# Patient Record
Sex: Female | Born: 1965 | Race: White | Hispanic: No | Marital: Married | Smoking: Former smoker
Health system: Southern US, Community
[De-identification: ages and names within clinical notes are randomized; demographics above are authoritative.]

## PROBLEM LIST (undated history)

## (undated) DIAGNOSIS — G35D Multiple sclerosis, unspecified: Secondary | ICD-10-CM

## (undated) DIAGNOSIS — H539 Unspecified visual disturbance: Secondary | ICD-10-CM

## (undated) DIAGNOSIS — M1990 Unspecified osteoarthritis, unspecified site: Secondary | ICD-10-CM

## (undated) DIAGNOSIS — M5030 Other cervical disc degeneration, unspecified cervical region: Secondary | ICD-10-CM

## (undated) DIAGNOSIS — G35 Multiple sclerosis: Secondary | ICD-10-CM

## (undated) DIAGNOSIS — M810 Age-related osteoporosis without current pathological fracture: Secondary | ICD-10-CM

## (undated) HISTORY — DX: Age-related osteoporosis without current pathological fracture: M81.0

## (undated) HISTORY — DX: Unspecified visual disturbance: H53.9

## (undated) HISTORY — DX: Unspecified osteoarthritis, unspecified site: M19.90

## (undated) HISTORY — PX: BREAST BIOPSY: SHX20

## (undated) HISTORY — DX: Other cervical disc degeneration, unspecified cervical region: M50.30

## (undated) HISTORY — DX: Multiple sclerosis, unspecified: G35.D

## (undated) HISTORY — DX: Multiple sclerosis: G35

---

## 2012-12-07 DIAGNOSIS — M810 Age-related osteoporosis without current pathological fracture: Secondary | ICD-10-CM | POA: Insufficient documentation

## 2014-02-25 DIAGNOSIS — E785 Hyperlipidemia, unspecified: Secondary | ICD-10-CM | POA: Insufficient documentation

## 2014-02-25 DIAGNOSIS — M19011 Primary osteoarthritis, right shoulder: Secondary | ICD-10-CM | POA: Insufficient documentation

## 2014-02-25 DIAGNOSIS — G43909 Migraine, unspecified, not intractable, without status migrainosus: Secondary | ICD-10-CM | POA: Insufficient documentation

## 2014-04-21 DIAGNOSIS — E559 Vitamin D deficiency, unspecified: Secondary | ICD-10-CM | POA: Insufficient documentation

## 2015-07-15 DIAGNOSIS — F331 Major depressive disorder, recurrent, moderate: Secondary | ICD-10-CM | POA: Insufficient documentation

## 2015-07-23 DIAGNOSIS — M5416 Radiculopathy, lumbar region: Secondary | ICD-10-CM | POA: Insufficient documentation

## 2015-07-23 DIAGNOSIS — M5030 Other cervical disc degeneration, unspecified cervical region: Secondary | ICD-10-CM | POA: Insufficient documentation

## 2016-05-30 DIAGNOSIS — R29898 Other symptoms and signs involving the musculoskeletal system: Secondary | ICD-10-CM | POA: Insufficient documentation

## 2016-06-13 DIAGNOSIS — Z9181 History of falling: Secondary | ICD-10-CM | POA: Insufficient documentation

## 2016-07-13 ENCOUNTER — Ambulatory Visit (INDEPENDENT_AMBULATORY_CARE_PROVIDER_SITE_OTHER): Payer: 59 | Admitting: Neurology

## 2016-07-13 ENCOUNTER — Encounter: Payer: Self-pay | Admitting: *Deleted

## 2016-07-13 ENCOUNTER — Encounter: Payer: Self-pay | Admitting: Neurology

## 2016-07-13 VITALS — BP 102/76 | HR 68 | Resp 16 | Ht 65.0 in | Wt 151.0 lb

## 2016-07-13 DIAGNOSIS — R4189 Other symptoms and signs involving cognitive functions and awareness: Secondary | ICD-10-CM

## 2016-07-13 DIAGNOSIS — R208 Other disturbances of skin sensation: Secondary | ICD-10-CM | POA: Diagnosis not present

## 2016-07-13 DIAGNOSIS — G3184 Mild cognitive impairment, so stated: Secondary | ICD-10-CM | POA: Diagnosis not present

## 2016-07-13 DIAGNOSIS — R5383 Other fatigue: Secondary | ICD-10-CM | POA: Diagnosis not present

## 2016-07-13 DIAGNOSIS — G4700 Insomnia, unspecified: Secondary | ICD-10-CM | POA: Diagnosis not present

## 2016-07-13 DIAGNOSIS — R269 Unspecified abnormalities of gait and mobility: Secondary | ICD-10-CM

## 2016-07-13 DIAGNOSIS — G35 Multiple sclerosis: Secondary | ICD-10-CM | POA: Diagnosis not present

## 2016-07-13 MED ORDER — ARMODAFINIL 250 MG PO TABS: 250.0000 mg | ORAL_TABLET | Freq: Every day | ORAL | 5 refills | Status: DC

## 2016-07-13 MED ORDER — GABAPENTIN 300 MG PO CAPS: ORAL_CAPSULE | ORAL | 11 refills | Status: DC

## 2016-07-13 NOTE — Progress Notes (Signed)
[facility]  PATIENT: Diane Carson DOB: 06/14/1966  REFERRING DOCTOR OR PCP:  Referred by Dr. Stacy Gardner. Her PCP is Guerry Bruin. SOURCE: Patient, notes from Dr. Anne Hahn, MRI and lab reports, MRI images on PACS.  _________________________________   HISTORICAL  CHIEF COMPLAINT:  Chief Complaint  Patient presents with  . Multiple Sclerosis    Diane Carson is here for 2nd opinion regarding tx. for MS.  Not sure if she will then stay with Dr. Anne Hahn in [facility], as she lives there, or transfer care here to Dr. Epimenio Foot.  Sts. she was dx. in December 2017.  Dx. with MRI T-spine, Brain.  No LP. Sts. she  has been having pain/numbness bilat feet,  electrical jolt sensations bilat legs, fatigue for yrs.  Sts. in 2013, she had an episode of numbness from the chest down--was treated with IV steroids at [facility] and numbness improved.  Has seen Dr. Hollice Espy at  . Numbness    [facility] for same./fim    HISTORY OF PRESENT ILLNESS:  I had the pleasure seeing you patient, Diane Carson, at [facility] for neurologic second opinion consultation regarding a diagnosis of multiple sclerosis.  In 1999, she had left optic neuritis. She did not receive steroids at that time but did have an MRI of the brain that was reportedly normal. She did very well for the next 14 years. In 2013, she woke up with numbness in her feet the one day. Over the next 2 days numbness evolved to be more intense and be in the distribution up to the mid thoracic level.   She also had decreased balance and fell a couple times. She noted that the numbness was near total of the breast. She received several days of IV steroids and symptoms improved, but not completely to baseline.     Over the last 4 years, she has had no definite exacerbation. However, she has noted more difficulty with cognitive function, emotional lability, and fatigue. She used to work part-time without difficulty and  is unable to do that currently.  Gait/strength/sensation:   She notes that her gait will get a little worse if she walks longer distances. She stumbles some but does not fall. She notes that the right leg will give out. If she goes longer distance the right leg will become more tired. There are also dysesthetic sensations in both feet. She is walking on pebbles.   Bladder/bowel: She denies any difficulty with bladder urgency, frequency or hesitancy. She does not have nocturia. She has some constipation but this has been a long-term issue for her.  Vision: Even though the optic neuritis was on the left, she notes more of visual blurring on the right. She does not note any asymmetry in color vision. She has seen her ophthalmologist within the last year and was just prescribed reading glasses. Told that there was not evidence of her prior optic neuritis.  Fatigue/sleep:   She reports fatigue that occurs on a daily basis. In general, it is milder in the morning and is worse later in the day, especially if she has had a more active day. Worse during warm weather or if she gets hot.   She has both sleep onset and sleep maintenance insomnia. Trazodone has helped the sleep onset but she still wakes up multiple times at night and sometimes has trouble getting back asleep.  Mood/cognition:   She reports both depression and anxiety. She has been on Lexapro with some benefit. It  has helped her tearfulness and anxiety some but incompletely. She still has a lot of irritability and apathy. She gets frustrated easily. She notes cognitive difficulties that have progressed over the last couple of years. She has a lot of difficulty with recall but does better with hints. She has some trouble with verbal fluency, parallel processing and executive function.  She will sometimes have inappropriate crying.  I personally reviewed the MRI of the brain and spine dated 06/07/2016 and compared with the MRI dated 08/28/2011.  The  older MRI shows a couple small T2/FLAIR hyperintense foci in the deep white matter and white matter. On the current study, there are about 6 or 7 more foci in the hemispheres including more in the periventricular white matter and 2 foci in the right cerebellar hemisphere. The 2017 MRI of the cervical spine shows mild degenerative changes (with foraminal narrowing to the left at C7T1) 5but a normal spinal cord. The 2017 MRI of the thoracic spine shows a chronic plaque at T7.  Also, there is a disc protrusion at T9-T10.     I also reviewed laboratory data from December 2017.The NMO Ab is negative.  Vitamin D is low normal.  Cholesterol is mildly elevated.  REVIEW OF SYSTEMS: Constitutional: No fevers, chills, sweats, or change in appetite.   She has fatigue and sleepiness Eyes: No visual changes, double vision, eye pain Ear, nose and throat: No hearing loss, ear pain, nasal congestion, sore throat Cardiovascular: No chest pain, palpitations Respiratory: No shortness of breath at rest or with exertion.   No wheezes GastrointestinaI: No nausea, vomiting, diarrhea, abdominal pain, fecal incontinence Genitourinary: No dysuria, urinary retention or frequency.  No nocturia. Musculoskeletal: No neck pain, back pain Integumentary: No rash, pruritus, skin lesions Neurological: as above Psychiatric: No depression at this time.  No anxiety Endocrine: No palpitations, diaphoresis, change in appetite, change in weigh or increased thirst Hematologic/Lymphatic: No anemia, purpura, petechiae. Allergic/Immunologic: No itchy/runny eyes, nasal congestion, recent allergic reactions, rashes  ALLERGIES: Allergies  Allergen Reactions  . Codeine Shortness Of Breath    HOME MEDICATIONS:  Current Outpatient Prescriptions:  .  ALPRAZolam (XANAX) 0.5 MG tablet, , Disp: , Rfl:  .  calcium carbonate (OSCAL) 1500 (600 Ca) MG TABS tablet, Take 600 mg of elemental calcium by mouth 2 (two) times daily with a meal.,  Disp: , Rfl:  .  cholecalciferol (VITAMIN D) 1000 units tablet, Take 5,000 Units by mouth daily., Disp: , Rfl:  .  escitalopram (LEXAPRO) 20 MG tablet, TAKE 1 TABLET(20 MG) BY MOUTH DAILY, Disp: , Rfl:  .  meloxicam (MOBIC) 15 MG tablet, TAKE 1 TABLET(15 MG) BY MOUTH DAILY, Disp: , Rfl:  .  Multiple Vitamin (MULTIVITAMIN) tablet, Take 1 tablet by mouth daily., Disp: , Rfl:  .  traZODone (DESYREL) 50 MG tablet, , Disp: , Rfl:  .  Armodafinil 250 MG tablet, Take 1 tablet (250 mg total) by mouth daily., Disp: 30 tablet, Rfl: 5 .  gabapentin (NEURONTIN) 300 MG capsule, Take one pill po qAM and 2 pills po qHS, Disp: 90 capsule, Rfl: 11  PAST MEDICAL HISTORY: Past Medical History:  Diagnosis Date  . Degenerative disc disease, cervical   . Multiple sclerosis (HCC)   . Osteoarthritis    thumbs  . Osteoporosis   . Vision abnormalities     PAST SURGICAL HISTORY: Past Surgical History:  Procedure Laterality Date  . BREAST BIOPSY      FAMILY HISTORY: Family History  Problem Relation Age of  Onset  . Adopted: Yes    SOCIAL HISTORY:  Social History   Social History  . Marital status: Married    Spouse name: N/A  . Number of children: N/A  . Years of education: N/A   Occupational History  . Not on file.   Social History Main Topics  . Smoking status: Former Games developer  . Smokeless tobacco: Never Used  . Alcohol use No  . Drug use:     Types: Marijuana     Comment: daily marijuana use  . Sexual activity: Not on file   Other Topics Concern  . Not on file   Social History Narrative  . No narrative on file     PHYSICAL EXAM  Vitals:   07/13/16 0906  BP: 102/76  Pulse: 68  Resp: 16  Weight: 151 lb (68.5 kg)  Height: 5\' 5"  (1.651 m)    Body mass index is 25.13 kg/m.   General: The patient is well-developed and well-nourished and in no acute distress  Eyes:  Funduscopic exam shows normal optic discs and retinal vessels.  Neck: The neck is supple, no carotid  bruits are noted.  The neck is nontender.  Cardiovascular: The heart has a regular rate and rhythm with a normal S1 and S2. There were no murmurs, gallops or rubs. Lungs are clear to auscultation.  Skin: Extremities are without significant edema.  Musculoskeletal:  Back is nontender  Neurologic Exam  Mental status: The patient is alert and oriented x 3 at the time of the examination. The patient has apparent normal recent and remote memory, with an apparently normal attention span and concentration ability.   Speech is normal.  Cranial nerves: Extraocular movements are full. Pupils are equal, round, and reactive to light and accomodation.  Visual fields are full.  Facial symmetry is present. There is good facial sensation to soft touch bilaterally.Facial strength is normal.  Trapezius and sternocleidomastoid strength is normal. No dysarthria is noted.  The tongue is midline, and the patient has symmetric elevation of the soft palate. No obvious hearing deficits are noted.  Motor:  Muscle bulk is normal.   Tone is normal. Strength is  5 / 5 in all 4 extremities.   Sensory: Sensory testing is intact to pinprick, soft touch and vibration sensation in all 4 extremities.  Coordination: Cerebellar testing reveals good finger-nose-finger and heel-to-shin bilaterally.  Gait and station: Station is normal.   Gait is normal. Tandem gait is wide.  Romberg is negative.   Reflexes: Deep tendon reflexes are symmetric and normalin arms.   She has increased DTRs in legs with spread at the knees.   Plantar responses are flexor.    DIAGNOSTIC DATA (LABS, IMAGING, TESTING) - I reviewed patient records, labs, notes, testing and imaging myself where available.      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 The combination of her history, physical and MRI findings are consistent with definite relapsing remitting multiple sclerosis.    We had a long conversation about these modifying therapies to reduce lapses and future disability.  She is most interested in one of the oral agents and we discussed the pros and cons of the options and she decided to start Tecfidera. I will check a baseline CBC and liver function tests. Additionally, I am going to check her JCV antibody. If she has progression, I would consider a switch to Tysabri help her fatigue and cognitive fog.     Due to her fatigue with some daytime sleepiness, we will start Nuvigil. Additionally I adjusted her gabapentin dose to 300 mg in the morning and 600 mg at night to try to help her insomnia and dysesthesias more.  She will return to see me in 3-4 months or sooner if there are any new or worsening neurologic symptoms.  Thank you for asking me to see Mrs. Sowder for a neurologic consultation. Please let me know if I can be of further assistance with her or other patients in the future.    Elycia Woodside A. Epimenio Foot, MD, PhD 07/13/2016, 11:42 AM Certified in Neurology, Clinical Neurophysiology, Sleep Medicine, Pain Medicine and Neuroimaging  [facility] [address] [place], [place] [address] [phone]

## 2016-07-14 ENCOUNTER — Encounter: Payer: Self-pay | Admitting: *Deleted

## 2016-07-14 LAB — CBC WITH DIFFERENTIAL/PLATELET
BASOS: 1 %
Basophils Absolute: 0.1 10*3/uL (ref 0.0–0.2)
EOS (ABSOLUTE): 0.1 10*3/uL (ref 0.0–0.4)
Eos: 1 %
HEMOGLOBIN: 13.2 g/dL (ref 11.1–15.9)
Hematocrit: 39.2 % (ref 34.0–46.6)
IMMATURE GRANS (ABS): 0 10*3/uL (ref 0.0–0.1)
IMMATURE GRANULOCYTES: 0 %
LYMPHS: 30 %
Lymphocytes Absolute: 2.4 10*3/uL (ref 0.7–3.1)
MCH: 30.1 pg (ref 26.6–33.0)
MCHC: 33.7 g/dL (ref 31.5–35.7)
MCV: 89 fL (ref 79–97)
MONOCYTES: 8 %
Monocytes Absolute: 0.7 10*3/uL (ref 0.1–0.9)
NEUTROS ABS: 4.9 10*3/uL (ref 1.4–7.0)
NEUTROS PCT: 60 %
PLATELETS: 397 10*3/uL — AB (ref 150–379)
RBC: 4.39 x10E6/uL (ref 3.77–5.28)
RDW: 14 % (ref 12.3–15.4)
WBC: 8.2 10*3/uL (ref 3.4–10.8)

## 2016-07-14 LAB — HEPATIC FUNCTION PANEL
ALK PHOS: 61 IU/L (ref 39–117)
ALT: 21 IU/L (ref 0–32)
AST: 25 IU/L (ref 0–40)
Albumin: 4.5 g/dL (ref 3.5–5.5)
BILIRUBIN TOTAL: 0.2 mg/dL (ref 0.0–1.2)
BILIRUBIN, DIRECT: 0.08 mg/dL (ref 0.00–0.40)
TOTAL PROTEIN: 7 g/dL (ref 6.0–8.5)

## 2016-07-17 ENCOUNTER — Telehealth: Payer: Self-pay | Admitting: *Deleted

## 2016-07-17 NOTE — Telephone Encounter (Signed)
Tecfidera PA completed and faxed to [facility] fax# [phone]/fim

## 2016-07-18 NOTE — Telephone Encounter (Signed)
Fax received from [employer] (phone# [phone]).   Tecfidera PA approved thru 07-17-21.  PA# [id number]/fim

## 2016-08-01 DIAGNOSIS — G5603 Carpal tunnel syndrome, bilateral upper limbs: Secondary | ICD-10-CM | POA: Insufficient documentation

## 2016-09-07 DIAGNOSIS — M1811 Unilateral primary osteoarthritis of first carpometacarpal joint, right hand: Secondary | ICD-10-CM | POA: Insufficient documentation

## 2016-09-07 HISTORY — PX: JOINT REPLACEMENT: SHX530

## 2016-10-19 ENCOUNTER — Encounter: Payer: Self-pay | Admitting: Neurology

## 2016-10-19 ENCOUNTER — Encounter (INDEPENDENT_AMBULATORY_CARE_PROVIDER_SITE_OTHER): Payer: Self-pay

## 2016-10-19 ENCOUNTER — Ambulatory Visit (INDEPENDENT_AMBULATORY_CARE_PROVIDER_SITE_OTHER): Payer: 59 | Admitting: Neurology

## 2016-10-19 VITALS — BP 121/75 | HR 78 | Resp 16 | Ht 65.0 in | Wt 145.0 lb

## 2016-10-19 DIAGNOSIS — G35 Multiple sclerosis: Secondary | ICD-10-CM

## 2016-10-19 DIAGNOSIS — R5383 Other fatigue: Secondary | ICD-10-CM

## 2016-10-19 DIAGNOSIS — R208 Other disturbances of skin sensation: Secondary | ICD-10-CM

## 2016-10-19 DIAGNOSIS — R269 Unspecified abnormalities of gait and mobility: Secondary | ICD-10-CM

## 2016-10-19 DIAGNOSIS — G3184 Mild cognitive impairment, so stated: Secondary | ICD-10-CM

## 2016-10-19 DIAGNOSIS — R4189 Other symptoms and signs involving cognitive functions and awareness: Secondary | ICD-10-CM

## 2016-10-19 MED ORDER — ARMODAFINIL 250 MG PO TABS: 250.0000 mg | ORAL_TABLET | Freq: Every day | ORAL | 5 refills | Status: DC

## 2016-10-19 NOTE — Progress Notes (Signed)
[facility]  PATIENT: Diane Carson DOB: 10/28/1965  REFERRING DOCTOR OR PCP:  Referred by Dr. Stacy Gardner. Her PCP is Guerry Bruin. SOURCE: Patient, notes from Dr. Anne Hahn, MRI and lab reports, MRI images on PACS.  _________________________________   HISTORICAL  CHIEF COMPLAINT:  Chief Complaint  Patient presents with  . Multiple Sclerosis    Sts. she is tolerating Tecfiderea well.  Nuvigil doesn't help fatigue but has helped brain fog./fim    HISTORY OF PRESENT ILLNESS:   Diane Carson is a 51 yo woman with MS.  MS:   She was started on Tecfidera and tolerates it well.   She had a few episodes of flushing  But no problems the last month.   SHe has not had nay new MS symptoms or exacerbations.     Gait/strength/sensation:   She notes that her gait will get a little worse if she walks longer distances. She stumbles some but does not fall. She notes that the right leg will give out. If she goes longer distance the right leg will become more tired. There are also dysesthetic sensations in both feet. She is walking on pebbles.   Bladder/bowel: She denies any difficulty with bladder urgency, frequency or hesitancy. She does not have nocturia. She has some constipation but this has been a long-term issue for her.  Vision: She notes blurry vision bilaterally.   She had ON on the left in the past.   Colors do not appear desaturated.  . She has seen her ophthalmologist within the last year. Told that there was not evidence of her prior optic neuritis.  Fatigue/sleep:   She has daily fatigue.   Fatigue did not improve as much as mental fog did on the armodafinil.  Fatigue is milder in the morning and is worse later in the day, especially if she has had a more active day. Worse during warm weather or if she gets hot.   She has both sleep onset and sleep maintenance insomnia. Trazodone has helped the sleep onset and sleep maintenance issues.   Mood/cognition:    She reports both depression and anxiety. She has been on Lexapro with some benefit. It has helped her tearfulness and anxiety some but incompletely. She still has a lot of irritability and apathy. She gets frustrated easily. She notes cognitive difficulties that have progressed over the last couple of years. She ws having a lot of difficulty with recall and verbal fluency and parallel processing --- all better on Armodafinil.   .  MS History:    In 1999, she had left optic neuritis. She did not receive steroids at that time but did have an MRI of the brain that was reportedly normal. She did very well for the next 14 years. In 2013, she woke up with numbness in her feet the one day. Over the next 2 days numbness evolved to be more intense and be in the distribution up to the mid thoracic level.   She also had decreased balance and fell a couple times. She noted that the numbness was near total of the breast. She received several days of IV steroids and symptoms improved, but not completely to baseline.    Between 2013 and 2017,  she has had no definite exacerbation. However, she has noted more difficulty with cognitive function, emotional lability, and fatigue.    MRI of the brain and spine dated 06/07/2016 and compared with the MRI dated 08/28/2011.  The older MRI shows a couple  small T2/FLAIR hyperintense foci in the deep white matter and white matter. On the current study, there are about 6 or 7 more foci in the hemispheres including more in the periventricular white matter and 2 foci in the right cerebellar hemisphere. The 2017 MRI of the cervical spine shows mild degenerative changes (with foraminal narrowing to the left at C7T1) 5but a normal spinal cord. The 2017 MRI of the thoracic spine shows a chronic plaque at T7.  Also, there is a disc protrusion at T9-T10.     I also reviewed laboratory data from December 2017.The NMO Ab is negative.  Vitamin D is low normal.    REVIEW OF SYSTEMS: Constitutional:  No fevers, chills, sweats, or change in appetite.   She has fatigue and sleepiness Eyes: No visual changes, double vision, eye pain Ear, nose and throat: No hearing loss, ear pain, nasal congestion, sore throat Cardiovascular: No chest pain, palpitations Respiratory: No shortness of breath at rest or with exertion.   No wheezes GastrointestinaI: No nausea, vomiting, diarrhea, abdominal pain, fecal incontinence Genitourinary: No dysuria, urinary retention or frequency.  No nocturia. Musculoskeletal: No neck pain, back pain Integumentary: No rash, pruritus, skin lesions Neurological: as above Psychiatric: No depression at this time.  No anxiety Endocrine: No palpitations, diaphoresis, change in appetite, change in weigh or increased thirst Hematologic/Lymphatic: No anemia, purpura, petechiae. Allergic/Immunologic: No itchy/runny eyes, nasal congestion, recent allergic reactions, rashes  ALLERGIES: Allergies  Allergen Reactions  . Codeine Shortness Of Breath    HOME MEDICATIONS:  Current Outpatient Prescriptions:  .  ALPRAZolam (XANAX) 0.5 MG tablet, , Disp: , Rfl:  .  Armodafinil 250 MG tablet, Take 1 tablet (250 mg total) by mouth daily., Disp: 30 tablet, Rfl: 5 .  calcium carbonate (OSCAL) 1500 (600 Ca) MG TABS tablet, Take 600 mg of elemental calcium by mouth 2 (two) times daily with a meal., Disp: , Rfl:  .  cholecalciferol (VITAMIN D) 1000 units tablet, Take 5,000 Units by mouth daily., Disp: , Rfl:  .  Dimethyl Fumarate 240 MG CPDR, Take 240 mg by mouth 2 (two) times daily., Disp: , Rfl:  .  escitalopram (LEXAPRO) 20 MG tablet, TAKE 1 TABLET(20 MG) BY MOUTH DAILY, Disp: , Rfl:  .  gabapentin (NEURONTIN) 300 MG capsule, Take one pill po qAM and 2 pills po qHS, Disp: 90 capsule, Rfl: 11 .  meloxicam (MOBIC) 15 MG tablet, TAKE 1 TABLET(15 MG) BY MOUTH DAILY, Disp: , Rfl:  .  Multiple Vitamin (MULTIVITAMIN) tablet, Take 1 tablet by mouth daily., Disp: , Rfl:  .  traZODone  (DESYREL) 50 MG tablet, , Disp: , Rfl:   PAST MEDICAL HISTORY: Past Medical History:  Diagnosis Date  . Degenerative disc disease, cervical   . Multiple sclerosis (HCC)   . Osteoarthritis    thumbs  . Osteoporosis   . Vision abnormalities     PAST SURGICAL HISTORY: Past Surgical History:  Procedure Laterality Date  . BREAST BIOPSY      FAMILY HISTORY: Family History  Problem Relation Age of Onset  . Adopted: Yes    SOCIAL HISTORY:  Social History   Social History  . Marital status: Married    Spouse name: N/A  . Number of children: N/A  . Years of education: N/A   Occupational History  . Not on file.   Social History Main Topics  . Smoking status: Former Games developer  . Smokeless tobacco: Never Used  . Alcohol use No  . Drug use: Yes  Types: Marijuana     Comment: daily marijuana use  . Sexual activity: Not on file   Other Topics Concern  . Not on file   Social History Narrative  . No narrative on file     PHYSICAL EXAM  Vitals:   10/19/16 1305  BP: 121/75  Pulse: 78  Resp: 16  Weight: 145 lb (65.8 kg)  Height: 5\' 5"  (1.651 m)    Body mass index is 24.13 kg/m.   General: The patient is well-developed and well-nourished and in no acute distress    Neurologic Exam  Mental status: The patient is alert and oriented x 3 at the time of the examination. The patient has apparent normal recent and remote memory, with an apparently normal attention span and concentration ability.   Speech is normal.  Cranial nerves: Extraocular movements are full. Facial strength and sensation are normal.  Trapezius and sternocleidomastoid strength is normal. No dysarthria is noted.  The tongue is midline, and the patient has symmetric elevation of the soft palate. No obvious hearing deficits are noted.  Motor:  Muscle bulk is normal.   Tone is normal. Strength is  5 / 5 in all 4 extremities.   Sensory: Sensory testing is intact to pinprick, soft touch and vibration  sensation in arms bu decreased vibration in both feet, left worse than right..  Coordination: Cerebellar testing reveals good finger-nose-finger and heel-to-shin bilaterally.  Gait and station: Station is normal.   Gait is normal. Tandem gait is wide.  Romberg is negative.   Reflexes: Deep tendon reflexes are symmetric and normalin arms.   She has increased DTRs in legs with spread at the knees.        DIAGNOSTIC DATA (LABS, IMAGING, TESTING) - I reviewed patient records, labs, notes, testing and imaging myself where available.      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   Continue Tecfidera.   Check CBC with Diff.   Later this year we will check an MRI of the brain to make sure that there is not subclinical progression. If present, we will need to consider a different medication. 2.   Continue armodafinil 3.    She will return to see me in 5-6 months or sooner if there are new or worsening neurologic symptoms.  Janeisha Ryle A. Epimenio Foot, MD, PhD 10/19/2016, 1:24 PM Certified in Neurology, Clinical Neurophysiology, Sleep Medicine, Pain Medicine and Neuroimaging  [facility] [address], [address] [place], [place] [address] [phone]

## 2016-10-20 LAB — CBC WITH DIFFERENTIAL/PLATELET
BASOS: 1 %
Basophils Absolute: 0.1 10*3/uL (ref 0.0–0.2)
EOS (ABSOLUTE): 0.1 10*3/uL (ref 0.0–0.4)
Eos: 1 %
Hematocrit: 37.4 % (ref 34.0–46.6)
Hemoglobin: 12.3 g/dL (ref 11.1–15.9)
IMMATURE GRANS (ABS): 0 10*3/uL (ref 0.0–0.1)
Immature Granulocytes: 0 %
LYMPHS: 39 %
Lymphocytes Absolute: 3.9 10*3/uL — ABNORMAL HIGH (ref 0.7–3.1)
MCH: 29.1 pg (ref 26.6–33.0)
MCHC: 32.9 g/dL (ref 31.5–35.7)
MCV: 88 fL (ref 79–97)
Monocytes Absolute: 1 10*3/uL — ABNORMAL HIGH (ref 0.1–0.9)
Monocytes: 10 %
Neutrophils Absolute: 5 10*3/uL (ref 1.4–7.0)
Neutrophils: 49 %
PLATELETS: 395 10*3/uL — AB (ref 150–379)
RBC: 4.23 x10E6/uL (ref 3.77–5.28)
RDW: 14 % (ref 12.3–15.4)
WBC: 10 10*3/uL (ref 3.4–10.8)

## 2016-10-23 ENCOUNTER — Telehealth: Payer: Self-pay | Admitting: *Deleted

## 2016-10-23 NOTE — Telephone Encounter (Signed)
I have spoken with Diane Carson and per RAS, advised lab work done in our office is fine.  She verbalized understanding of same/fim

## 2016-10-23 NOTE — Telephone Encounter (Signed)
-----   Message from Asa Lente, MD sent at 10/20/2016  3:37 PM EDT ----- Please let the patient know that the lab work is fine.

## 2017-04-18 ENCOUNTER — Other Ambulatory Visit (HOSPITAL_BASED_OUTPATIENT_CLINIC_OR_DEPARTMENT_OTHER): Payer: Self-pay | Admitting: Physician Assistant

## 2017-04-18 DIAGNOSIS — Z1231 Encounter for screening mammogram for malignant neoplasm of breast: Secondary | ICD-10-CM

## 2017-04-20 ENCOUNTER — Encounter: Payer: Self-pay | Admitting: Neurology

## 2017-04-20 ENCOUNTER — Ambulatory Visit (INDEPENDENT_AMBULATORY_CARE_PROVIDER_SITE_OTHER): Payer: 59 | Admitting: Neurology

## 2017-04-20 VITALS — BP 114/72 | HR 76 | Ht 65.0 in | Wt 140.0 lb

## 2017-04-20 DIAGNOSIS — R4189 Other symptoms and signs involving cognitive functions and awareness: Secondary | ICD-10-CM

## 2017-04-20 DIAGNOSIS — R208 Other disturbances of skin sensation: Secondary | ICD-10-CM | POA: Diagnosis not present

## 2017-04-20 DIAGNOSIS — G3184 Mild cognitive impairment, so stated: Secondary | ICD-10-CM | POA: Diagnosis not present

## 2017-04-20 DIAGNOSIS — G35 Multiple sclerosis: Secondary | ICD-10-CM

## 2017-04-20 DIAGNOSIS — R5383 Other fatigue: Secondary | ICD-10-CM | POA: Diagnosis not present

## 2017-04-20 DIAGNOSIS — R269 Unspecified abnormalities of gait and mobility: Secondary | ICD-10-CM

## 2017-04-20 DIAGNOSIS — F418 Other specified anxiety disorders: Secondary | ICD-10-CM | POA: Diagnosis not present

## 2017-04-20 DIAGNOSIS — G4700 Insomnia, unspecified: Secondary | ICD-10-CM | POA: Diagnosis not present

## 2017-04-20 MED ORDER — AMPHETAMINE-DEXTROAMPHET ER 15 MG PO CP24: 15.0000 mg | ORAL_CAPSULE | ORAL | 0 refills | Status: DC

## 2017-04-20 NOTE — Progress Notes (Signed)
[facility]  PATIENT: Diane Carson DOB: 07-Dec-1965  REFERRING DOCTOR OR PCP:  Referred by Dr. Stacy Gardner. Her PCP is Guerry Bruin. SOURCE: Patient, notes from Dr. Anne Hahn, MRI and lab reports, MRI images on PACS.  _________________________________   HISTORICAL  CHIEF COMPLAINT:  Chief Complaint  Patient presents with  . Multiple Sclerosis    She is doing well on Tecfidera.  Her fatigue is worse.  States she could not tolerate Nuvigil due to feeling "scattered and in overdrive".     HISTORY OF PRESENT ILLNESS:   Diane Carson is a 51 yo woman with MS.  Update 04/20/2017:    She feels her MS is mostly doing well. She is on Tecfidera. She gets occasional flushing but otherwise tolerates it well. There has not been any exacerbations. Her gait is doing about the same. She will stumble but has not had any major falls. At times right leg will give out, especially if she goes longer distances. There are some dysesthetic sensations in her feet, helped by gabapentin 300 mg po tid.   She denies any major problems with her bladder. There is chronic constipation. In the past she had optic neuritis on the left sometimes she feels that the vision is slightly blurry.  Her biggest problem is fatigue. She was unable to tolerate Nuvigil as it made her feel "scattered and an old drive".  She tried half a pill but it was not better.   Initially she thought it was helping but she didn't like how she felt. She continues to have insomnia that is both sleep onset and sleep maintenance. Trazodone has helped this. She is on Lexapro for depression and anxiety.  Sometimes she feels overwhelmed (worse with brother having Stage 4 colon cancer).   She has difficulty with short-term memory, focus and attention and other cognitive tasks. Initially, Nuvigil was helping her with this but she stopped that she had trouble tolerating it.   _______________________________________ From  10/19/2016:  MS:   She was started on Tecfidera and tolerates it well.   She had a few episodes of flushing  But no problems the last month.   SHe has not had nay new MS symptoms or exacerbations.     Gait/strength/sensation:   She notes that her gait will get a little worse if she walks longer distances. She stumbles some but does not fall. She notes that the right leg will give out. If she goes longer distance the right leg will become more tired. There are also dysesthetic sensations in both feet. She is walking on pebbles.   Bladder/bowel: She denies any difficulty with bladder urgency, frequency or hesitancy. She does not have nocturia. She has some constipation but this has been a long-term issue for her.  Vision: She notes blurry vision bilaterally.   She had ON on the left in the past.   Colors do not appear desaturated.  . She has seen her ophthalmologist within the last year. Told that there was not evidence of her prior optic neuritis.  Fatigue/sleep:   She has daily fatigue.   Fatigue did not improve as much as mental fog did on the armodafinil.  Fatigue is milder in the morning and is worse later in the day, especially if she has had a more active day. Worse during warm weather or if she gets hot.   She has both sleep onset and sleep maintenance insomnia. Trazodone has helped the sleep onset and sleep maintenance issues.  Mood/cognition:   She reports both depression and anxiety. She has been on Lexapro with some benefit. It has helped her tearfulness and anxiety some but incompletely. She still has a lot of irritability and apathy. She gets frustrated easily. She notes cognitive difficulties that have progressed over the last couple of years. She ws having a lot of difficulty with recall and verbal fluency and parallel processing --- all better on Armodafinil.   .  MS History:    In 1999, she had left optic neuritis. She did not receive steroids at that time but did have an MRI of the  brain that was reportedly normal. She did very well for the next 14 years. In 2013, she woke up with numbness in her feet the one day. Over the next 2 days numbness evolved to be more intense and be in the distribution up to the mid thoracic level.   She also had decreased balance and fell a couple times. She noted that the numbness was near total of the breast. She received several days of IV steroids and symptoms improved, but not completely to baseline.    Between 2013 and 2017,  she has had no definite exacerbation. However, she has noted more difficulty with cognitive function, emotional lability, and fatigue.    MRI of the brain and spine dated 06/07/2016 and compared with the MRI dated 08/28/2011.  The older MRI shows a couple small T2/FLAIR hyperintense foci in the deep white matter and white matter. On the current study, there are about 6 or 7 more foci in the hemispheres including more in the periventricular white matter and 2 foci in the right cerebellar hemisphere. The 2017 MRI of the cervical spine shows mild degenerative changes (with foraminal narrowing to the left at C7T1) 5but a normal spinal cord. The 2017 MRI of the thoracic spine shows a chronic plaque at T7.  Also, there is a disc protrusion at T9-T10.     I also reviewed laboratory data from December 2017.The NMO Ab is negative.  Vitamin D is low normal.    REVIEW OF SYSTEMS: Constitutional: No fevers, chills, sweats, or change in appetite.   She has fatigue and sleepiness Eyes: No visual changes, double vision, eye pain Ear, nose and throat: No hearing loss, ear pain, nasal congestion, sore throat Cardiovascular: No chest pain, palpitations Respiratory: No shortness of breath at rest or with exertion.   No wheezes GastrointestinaI: No nausea, vomiting, diarrhea, abdominal pain, fecal incontinence Genitourinary: No dysuria, urinary retention or frequency.  No nocturia. Musculoskeletal: No neck pain, back pain Integumentary: No  rash, pruritus, skin lesions Neurological: as above Psychiatric: No depression at this time.  No anxiety Endocrine: No palpitations, diaphoresis, change in appetite, change in weigh or increased thirst Hematologic/Lymphatic: No anemia, purpura, petechiae. Allergic/Immunologic: No itchy/runny eyes, nasal congestion, recent allergic reactions, rashes  ALLERGIES: Allergies  Allergen Reactions  . Codeine Shortness Of Breath    HOME MEDICATIONS:  Current Outpatient Prescriptions:  .  ALPRAZolam (XANAX) 0.5 MG tablet, , Disp: , Rfl:  .  calcium carbonate (OSCAL) 1500 (600 Ca) MG TABS tablet, Take 600 mg of elemental calcium by mouth 2 (two) times daily with a meal., Disp: , Rfl:  .  cholecalciferol (VITAMIN D) 1000 units tablet, Take 5,000 Units by mouth daily., Disp: , Rfl:  .  Dimethyl Fumarate 240 MG CPDR, Take 240 mg by mouth 2 (two) times daily., Disp: , Rfl:  .  escitalopram (LEXAPRO) 20 MG tablet, TAKE 1 TABLET(20  MG) BY MOUTH DAILY, Disp: , Rfl:  .  gabapentin (NEURONTIN) 300 MG capsule, Take one pill po qAM and 2 pills po qHS, Disp: 90 capsule, Rfl: 11 .  meloxicam (MOBIC) 15 MG tablet, TAKE 1 TABLET(15 MG) BY MOUTH DAILY, Disp: , Rfl:  .  Multiple Vitamin (MULTIVITAMIN) tablet, Take 1 tablet by mouth daily., Disp: , Rfl:  .  traZODone (DESYREL) 50 MG tablet, , Disp: , Rfl:  .  amphetamine-dextroamphetamine (ADDERALL XR) 15 MG 24 hr capsule, Take 1 capsule by mouth every morning., Disp: 30 capsule, Rfl: 0  PAST MEDICAL HISTORY: Past Medical History:  Diagnosis Date  . Degenerative disc disease, cervical   . Multiple sclerosis (HCC)   . Osteoarthritis    thumbs  . Osteoporosis   . Vision abnormalities     PAST SURGICAL HISTORY: Past Surgical History:  Procedure Laterality Date  . BREAST BIOPSY    . JOINT REPLACEMENT Right 09/07/2016   thumb    FAMILY HISTORY: Family History  Problem Relation Age of Onset  . Adopted: Yes  . Colon cancer Brother     SOCIAL  HISTORY:  Social History   Social History  . Marital status: Married    Spouse name: N/A  . Number of children: N/A  . Years of education: N/A   Occupational History  . Not on file.   Social History Main Topics  . Smoking status: Former Games developermoker  . Smokeless tobacco: Never Used  . Alcohol use No  . Drug use: Yes    Types: Marijuana     Comment: daily marijuana use  . Sexual activity: Not on file   Other Topics Concern  . Not on file   Social History Narrative  . No narrative on file     PHYSICAL EXAM  Vitals:   04/20/17 0939  BP: 114/72  Pulse: 76  Weight: 140 lb (63.5 kg)  Height: 5\' 5"  (1.651 m)    Body mass index is 23.3 kg/m.   General: The patient is well-developed and well-nourished and in no acute distress    Neurologic Exam  Mental status: The patient is alert and oriented x 3 at the time of the examination. The patient has apparent normal recent and remote memory, with an apparently normal attention span and concentration ability.   Speech is normal.  Cranial nerves: Extraocular movements are full. Facial strength and sensation are normal.  Trapezius and sternocleidomastoid strength is normal. No dysarthria is noted.  The tongue is midline, and the patient has symmetric elevation of the soft palate. No obvious hearing deficits are noted.  Motor:  Muscle bulk is normal.   Tone is normal. Strength is  5 / 5 in all 4 extremities.   Sensory: She had reduced vibration sensation in both feet. This was worse on the left. Sensation to touch was normal and symmetric.   Coordination: Cerebellar testing reveals good finger-nose-finger and heel-to-shin bilaterally  Gait and station: Station is normal.   Gait shows mild right foot drop and is mildly wide. Tandem gait is wide.  Romberg is negative.   Reflexes: Deep tendon reflexes are symmetric and normal in arms.   She had increased deep tendon reflexes in the legs and there was spread at the knees.   25 foot  timed walk is 8.2 sec (average of 2 trials)    DIAGNOSTIC DATA (LABS, IMAGING, TESTING) - I reviewed patient records, labs, notes, testing and imaging myself where available.      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   She will continue Tecfidera. We will check a CBC and CMP.  2.    We will check an MRI of the brain to make sure that there is not subclinical progression. If present, we will need to consider a different medication. 3.    Ampyra for gait.   Check renal function labs 4.   Adderall for fatigue and reduced attention. 5.    She will return to see me in 5-6 months or sooner if there are new or worsening neurologic symptoms.  Tyrell Seifer A. Epimenio Foot, MD, PhD 04/20/2017, 2:39 PM Certified in Neurology, Clinical Neurophysiology, Sleep Medicine, Pain Medicine and Neuroimaging  [facility] [address] [place], [place] [address] [phone]

## 2017-04-21 LAB — CBC WITH DIFFERENTIAL/PLATELET
BASOS ABS: 0.1 10*3/uL (ref 0.0–0.2)
Basos: 1 %
EOS (ABSOLUTE): 0.2 10*3/uL (ref 0.0–0.4)
Eos: 2 %
Hematocrit: 40.5 % (ref 34.0–46.6)
Hemoglobin: 13.5 g/dL (ref 11.1–15.9)
IMMATURE GRANS (ABS): 0 10*3/uL (ref 0.0–0.1)
IMMATURE GRANULOCYTES: 0 %
LYMPHS: 39 %
Lymphocytes Absolute: 3.2 10*3/uL — ABNORMAL HIGH (ref 0.7–3.1)
MCH: 29.9 pg (ref 26.6–33.0)
MCHC: 33.3 g/dL (ref 31.5–35.7)
MCV: 90 fL (ref 79–97)
MONOS ABS: 0.7 10*3/uL (ref 0.1–0.9)
Monocytes: 8 %
NEUTROS PCT: 50 %
Neutrophils Absolute: 4.1 10*3/uL (ref 1.4–7.0)
PLATELETS: 434 10*3/uL — AB (ref 150–379)
RBC: 4.51 x10E6/uL (ref 3.77–5.28)
RDW: 13.5 % (ref 12.3–15.4)
WBC: 8.2 10*3/uL (ref 3.4–10.8)

## 2017-04-21 LAB — COMPREHENSIVE METABOLIC PANEL
A/G RATIO: 2 (ref 1.2–2.2)
ALK PHOS: 64 IU/L (ref 39–117)
ALT: 21 IU/L (ref 0–32)
AST: 24 IU/L (ref 0–40)
Albumin: 4.6 g/dL (ref 3.5–5.5)
BUN/Creatinine Ratio: 15 (ref 9–23)
BUN: 12 mg/dL (ref 6–24)
Bilirubin Total: 0.2 mg/dL (ref 0.0–1.2)
CALCIUM: 10.2 mg/dL (ref 8.7–10.2)
CO2: 27 mmol/L (ref 20–29)
Chloride: 97 mmol/L (ref 96–106)
Creatinine, Ser: 0.78 mg/dL (ref 0.57–1.00)
GFR calc Af Amer: 103 mL/min/{1.73_m2} (ref >59)
GFR, EST NON AFRICAN AMERICAN: 90 mL/min/{1.73_m2} (ref >59)
GLOBULIN, TOTAL: 2.3 g/dL (ref 1.5–4.5)
Glucose: 78 mg/dL (ref 65–99)
Potassium: 4.7 mmol/L (ref 3.5–5.2)
Sodium: 139 mmol/L (ref 134–144)
Total Protein: 6.9 g/dL (ref 6.0–8.5)

## 2017-04-23 ENCOUNTER — Telehealth: Payer: Self-pay | Admitting: *Deleted

## 2017-04-23 ENCOUNTER — Encounter: Payer: Self-pay | Admitting: *Deleted

## 2017-04-23 NOTE — Telephone Encounter (Signed)
-----   Message from Asa Lente, MD sent at 04/23/2017  1:15 PM EDT ----- Please let the patient know that the lab work is fine.

## 2017-04-23 NOTE — Telephone Encounter (Signed)
Spoke with pt. and advised lab work done in our office is fine.  She verbalized understanding of same/fim

## 2017-04-25 ENCOUNTER — Other Ambulatory Visit: Payer: 59

## 2017-04-25 ENCOUNTER — Telehealth: Payer: Self-pay | Admitting: Neurology

## 2017-04-25 NOTE — Telephone Encounter (Signed)
I spoke to patient and reschedule her MRI for Nov 13 at the [facility] mobile unit.

## 2017-04-25 NOTE — Telephone Encounter (Signed)
Pt called to inform she is sick and will not make MRI appointment today, please call

## 2017-04-27 ENCOUNTER — Telehealth: Payer: Self-pay | Admitting: *Deleted

## 2017-04-27 NOTE — Telephone Encounter (Signed)
PA for Ampyra 10mg  #60/30 completed via phone with [facility]. Dx: MS (G35), Gait Disturbance (R26.9). Case# [id number]. Information to be submitted for clinical review/fim

## 2017-04-30 ENCOUNTER — Telehealth: Payer: Self-pay | Admitting: *Deleted

## 2017-04-30 NOTE — Telephone Encounter (Signed)
Per Charlene at [facility] (phone# [phone]), no Pa needed for brand name Adderall XR 15mg .  Pharmacy should run rx. as brand.  Notification faxed to [facility], fax# [phone]/fim

## 2017-05-10 ENCOUNTER — Ambulatory Visit (HOSPITAL_BASED_OUTPATIENT_CLINIC_OR_DEPARTMENT_OTHER): Payer: Self-pay

## 2017-05-15 ENCOUNTER — Telehealth: Payer: Self-pay | Admitting: Neurology

## 2017-05-15 ENCOUNTER — Ambulatory Visit: Payer: 59

## 2017-05-15 DIAGNOSIS — G35 Multiple sclerosis: Secondary | ICD-10-CM

## 2017-05-15 MED ORDER — GADOPENTETATE DIMEGLUMINE 469.01 MG/ML IV SOLN
13.0000 mL | Freq: Once | INTRAVENOUS | Status: DC | PRN
Start: 2017-05-15 — End: 2023-05-22

## 2017-05-15 NOTE — Telephone Encounter (Signed)
Spoke with Diane Carson and explained insurance will cover generic Ampyra, not brand name.  She verbalized understanding of same/fim

## 2017-05-15 NOTE — Telephone Encounter (Signed)
Pt would like to know the update on the new medication that Dr. Epimenio Foot was going to prescribe to her. JBA

## 2017-05-17 ENCOUNTER — Telehealth: Payer: Self-pay | Admitting: *Deleted

## 2017-05-17 NOTE — Telephone Encounter (Signed)
Spoke with Diane Carson this morning and reviewed MRI results with her. She verbalized understanding of same/fim

## 2017-05-17 NOTE — Telephone Encounter (Signed)
-----   Message from Asa Lente, MD sent at 05/16/2017  2:25 PM EST ----- Please let her know that the MRI of the brain looked good. There was one small spot on the current MRI that was not seen in 2017 but otherwise the MRI is similar. Continue her medication.

## 2017-05-21 MED ORDER — DALFAMPRIDINE ER 10 MG PO TB12: 10.0000 mg | ORAL_TABLET | Freq: Two times a day (BID) | ORAL | 3 refills | Status: DC

## 2017-05-21 NOTE — Addendum Note (Signed)
Addended by: Candis SchatzMISENHEIMER, Mylinda Brook I on: 05/21/2017 04:52 PM   Modules accepted: Orders

## 2017-05-21 NOTE — Telephone Encounter (Signed)
Patient calling to get a Rx for generic Ampyra called to [facility] on [address] in [place].

## 2017-05-21 NOTE — Telephone Encounter (Signed)
Spoke with Buckner Malta and explained that Ampyra is filled thru a Psychologist, occupational. Pt. verbalized understanding of same. Rx. for generic Ampyra escribed to [facility]./fim

## 2017-05-23 ENCOUNTER — Telehealth: Payer: Self-pay | Admitting: *Deleted

## 2017-05-23 NOTE — Telephone Encounter (Signed)
PA for Dalfampridine 10mg  tablets #60/30 completed and faxed to [facility], fax# [phone]. Dx: MS (G35), Gait Disturbance (R26.9).  Timed walk 8.2 seconds (average of 2)/fim

## 2017-05-29 DIAGNOSIS — M5126 Other intervertebral disc displacement, lumbar region: Secondary | ICD-10-CM | POA: Insufficient documentation

## 2017-05-30 ENCOUNTER — Encounter (HOSPITAL_BASED_OUTPATIENT_CLINIC_OR_DEPARTMENT_OTHER): Payer: Self-pay

## 2017-05-30 ENCOUNTER — Ambulatory Visit (HOSPITAL_BASED_OUTPATIENT_CLINIC_OR_DEPARTMENT_OTHER)
Admission: RE | Admit: 2017-05-30 | Discharge: 2017-05-30 | Disposition: A | Discharge status: Home / Self Care | Payer: 59 | Source: Ambulatory Visit | Attending: Physician Assistant | Admitting: Physician Assistant

## 2017-05-30 DIAGNOSIS — Z1231 Encounter for screening mammogram for malignant neoplasm of breast: Secondary | ICD-10-CM | POA: Insufficient documentation

## 2017-05-30 NOTE — Telephone Encounter (Signed)
Fax received from [facility]. (phone# [phone]).  Dalfampridine approved for dates 05/23/17 thru 11/20/17.  PA# [id number]/fim

## 2017-06-04 ENCOUNTER — Telehealth: Payer: Self-pay | Admitting: Neurology

## 2017-06-04 MED ORDER — AMPHETAMINE-DEXTROAMPHET ER 15 MG PO CP24
15.0000 mg | ORAL_CAPSULE | ORAL | 0 refills | Status: DC
Start: 2017-06-04 — End: 2017-07-04

## 2017-06-04 NOTE — Addendum Note (Signed)
Addended by: Candis Schatz I on: 06/04/2017 10:22 AM   Modules accepted: Orders

## 2017-06-04 NOTE — Telephone Encounter (Signed)
Rx. up front [facility]/fim 

## 2017-06-04 NOTE — Telephone Encounter (Signed)
Rx. awaiting RAS sig/fim 

## 2017-06-04 NOTE — Telephone Encounter (Signed)
Patient requesting refill of amphetamine-dextroamphetamine (ADDERALL XR) 15 MG 24 hr capsule. She is out of medication.

## 2017-06-13 ENCOUNTER — Telehealth: Payer: Self-pay | Admitting: Neurology

## 2017-06-13 NOTE — Telephone Encounter (Signed)
Patient called and requested to speak with someone regarding the medication Ampyra. She states that her insurance would not approve it so Dr. Epimenio FootSater changed her to the generic, which is costing her 100 dollars per every copay. She has done some research on Ampyra's website and they will do a 60 day trial if Dr. Epimenio FootSater will fill out some paperwork and she is interested in trying this. Please call and advise.

## 2017-06-13 NOTE — Telephone Encounter (Signed)
Another option is compounded 4-AP, although it may still be unaffordable for pt. Chucky May/fim

## 2017-06-13 NOTE — Telephone Encounter (Signed)
LMTC. Will check to see how long she has been taking generic Ampyra.  If she has been taking it for 30 days and does not see an improvement, she can stop it./fim

## 2017-06-28 ENCOUNTER — Telehealth: Payer: Self-pay | Admitting: Neurology

## 2017-06-28 NOTE — Telephone Encounter (Signed)
Pt calling for refill of amphetamine-dextroamphetamine (ADDERALL XR) 15 MG 24 hr capsule, she is aware the office closes at 12 noon on Fridays and that the office will not be open on 07-02-2017 or 07-03-2017

## 2017-06-28 NOTE — Telephone Encounter (Signed)
I called the patient to make her aware that the medication was filled on Dec 3rd and so the earliest I could refill the medication would be 07/03/17. I informed her our office would be closed and that we could fill it on 07/04/2017. Pt was concerned stating that she would be out of pills and I informed her that she shouldn't be as the medication was written for  30 pills and that the order was written on 06/04/17. Pt stated she will double check and will call back on the 2nd and have this filled at that time.

## 2017-07-04 ENCOUNTER — Other Ambulatory Visit: Payer: Self-pay | Admitting: Neurology

## 2017-07-04 MED ORDER — AMPHETAMINE-DEXTROAMPHET ER 15 MG PO CP24: 15.0000 mg | ORAL_CAPSULE | ORAL | 0 refills | Status: DC

## 2017-07-04 NOTE — Telephone Encounter (Signed)
Adderall rx. up front [facility]/fim 

## 2017-07-09 ENCOUNTER — Other Ambulatory Visit: Payer: Self-pay | Admitting: Neurology

## 2017-07-09 ENCOUNTER — Telehealth: Payer: Self-pay | Admitting: Neurology

## 2017-07-09 NOTE — Telephone Encounter (Signed)
Pt calling for refill prescription for amphetamine-dextroamphetamine (ADDERALL XR) 15 MG 24 hr capsule, no changes in insurance information according to pt

## 2017-07-09 NOTE — Telephone Encounter (Signed)
Adderall rx. placed up front on 07/04/17/fim

## 2017-07-18 ENCOUNTER — Encounter: Payer: Self-pay | Admitting: *Deleted

## 2017-07-22 ENCOUNTER — Other Ambulatory Visit: Payer: Self-pay | Admitting: Neurology

## 2017-08-13 ENCOUNTER — Telehealth: Payer: Self-pay | Admitting: Neurology

## 2017-08-13 DIAGNOSIS — M48061 Spinal stenosis, lumbar region without neurogenic claudication: Secondary | ICD-10-CM | POA: Insufficient documentation

## 2017-08-13 NOTE — Telephone Encounter (Signed)
Patient requesting refill of amphetamine-dextroamphetamine (ADDERALL XR) 15 MG 24 hr capsule. ° ° °

## 2017-08-14 MED ORDER — AMPHETAMINE-DEXTROAMPHET ER 15 MG PO CP24: 15.0000 mg | ORAL_CAPSULE | ORAL | 0 refills | Status: DC

## 2017-08-14 NOTE — Telephone Encounter (Signed)
Rx. awaiting RAS sig/fim 

## 2017-08-14 NOTE — Telephone Encounter (Signed)
Adderall rx. up front [facility]/fim 

## 2017-08-14 NOTE — Addendum Note (Signed)
Addended by: Candis Schatz I on: 08/14/2017 09:15 AM   Modules accepted: Orders

## 2017-08-22 ENCOUNTER — Ambulatory Visit: Payer: 59 | Admitting: Neurology

## 2017-10-04 ENCOUNTER — Ambulatory Visit: Payer: 59 | Admitting: Neurology

## 2017-10-04 ENCOUNTER — Other Ambulatory Visit: Payer: Self-pay | Admitting: Neurology

## 2017-10-04 ENCOUNTER — Encounter: Payer: Self-pay | Admitting: Neurology

## 2017-10-04 MED ORDER — AMPHETAMINE-DEXTROAMPHET ER 15 MG PO CP24: 15.0000 mg | ORAL_CAPSULE | ORAL | 0 refills | Status: DC

## 2017-10-04 NOTE — Addendum Note (Signed)
Addended by: Candis Schatz I on: 10/04/2017 11:52 AM   Modules accepted: Orders

## 2017-10-04 NOTE — Telephone Encounter (Signed)
Pt has called asking for a refill for amphetamine-dextroamphetamine (ADDERALL XR) 15 MG 24 hr capsule

## 2017-10-25 DIAGNOSIS — M4316 Spondylolisthesis, lumbar region: Secondary | ICD-10-CM | POA: Insufficient documentation

## 2017-11-09 ENCOUNTER — Ambulatory Visit: Payer: 59 | Admitting: Neurology

## 2017-11-09 DIAGNOSIS — Z4889 Encounter for other specified surgical aftercare: Secondary | ICD-10-CM | POA: Insufficient documentation

## 2017-11-09 DIAGNOSIS — Z981 Arthrodesis status: Secondary | ICD-10-CM | POA: Insufficient documentation

## 2017-11-23 ENCOUNTER — Telehealth: Payer: Self-pay | Admitting: *Deleted

## 2017-11-23 NOTE — Telephone Encounter (Signed)
Fax received from [facility], phone# [phone].  Tecfidera approved until 11/23/22.  PA# [id number]

## 2017-11-28 IMAGING — MG 2D DIGITAL SCREENING BILATERAL MAMMOGRAM WITH CAD AND ADJUNCT TO
4 series · 4 of 12 positions shown · non-contrast
Comparison: Previous exam(s).

CLINICAL DATA: Screening.

EXAM:
2D DIGITAL SCREENING BILATERAL MAMMOGRAM WITH CAD AND ADJUNCT TOMO

[R MLO]
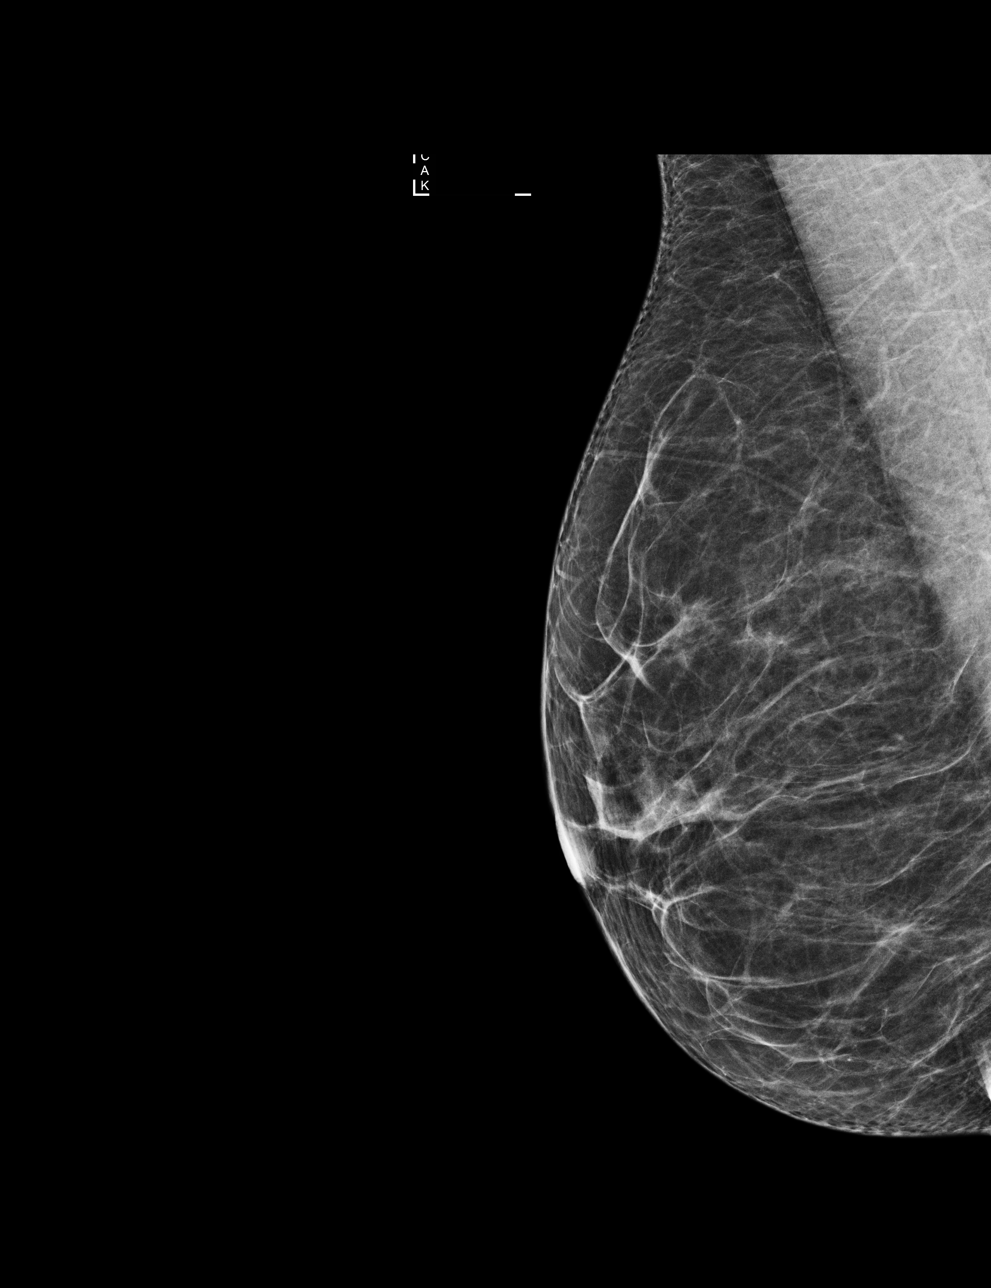

[L MLO]
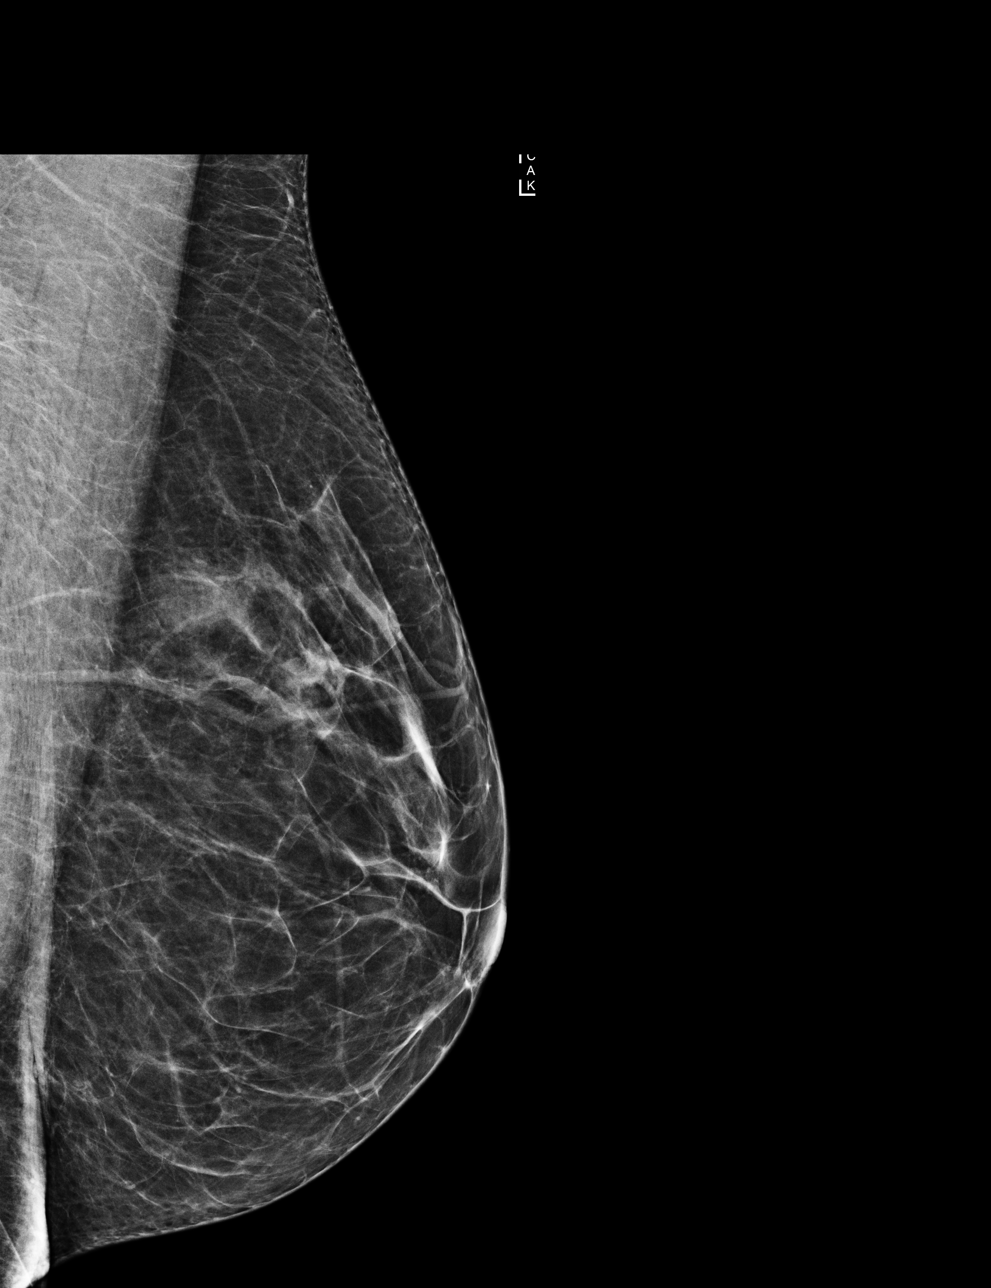

[L CC tomo · tomo slice 27/53.0]
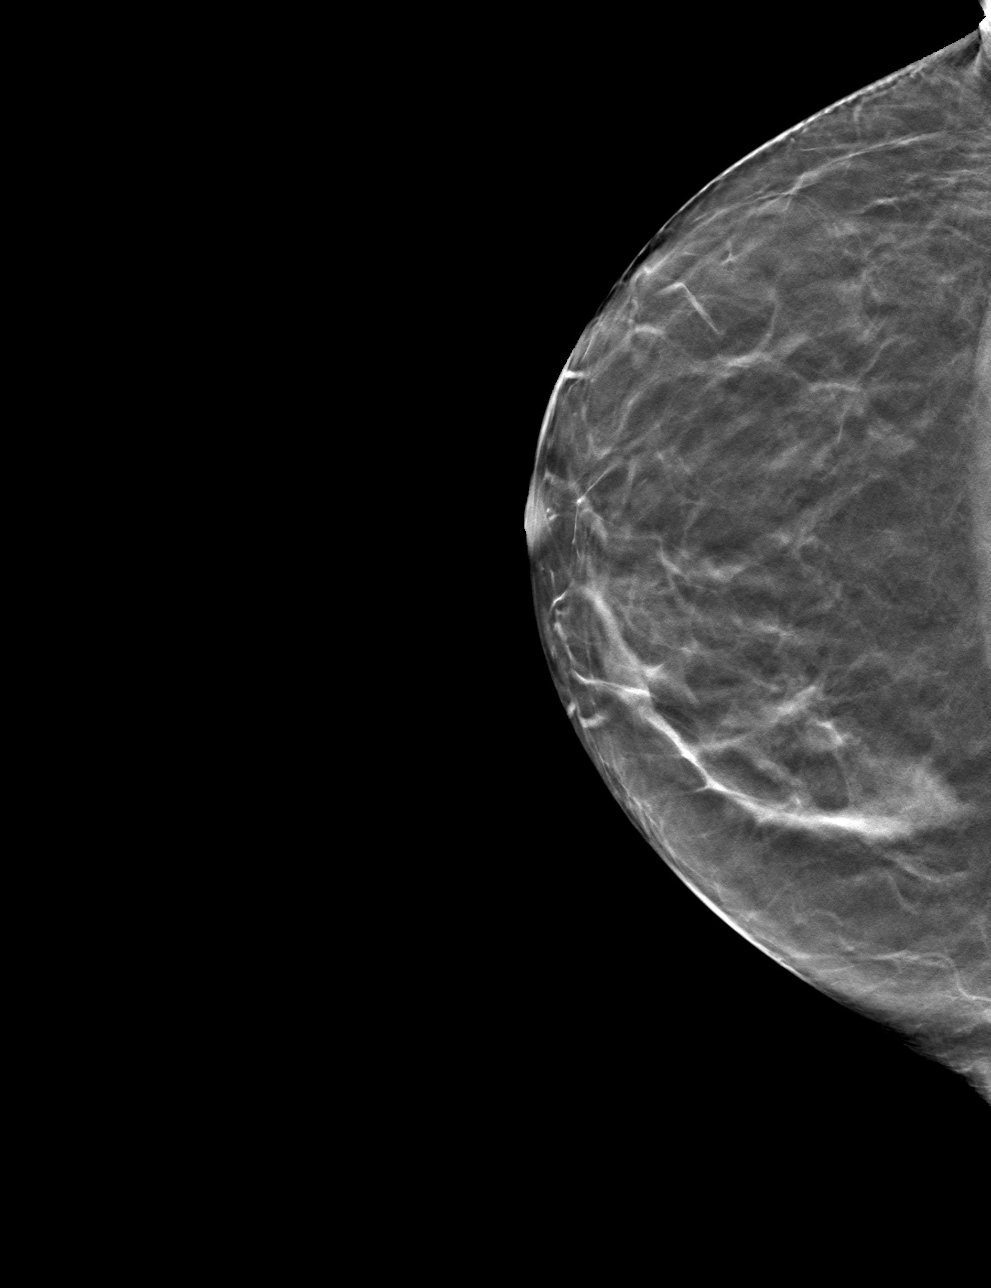

[R MLO tomo · tomo slice 25/48.0]
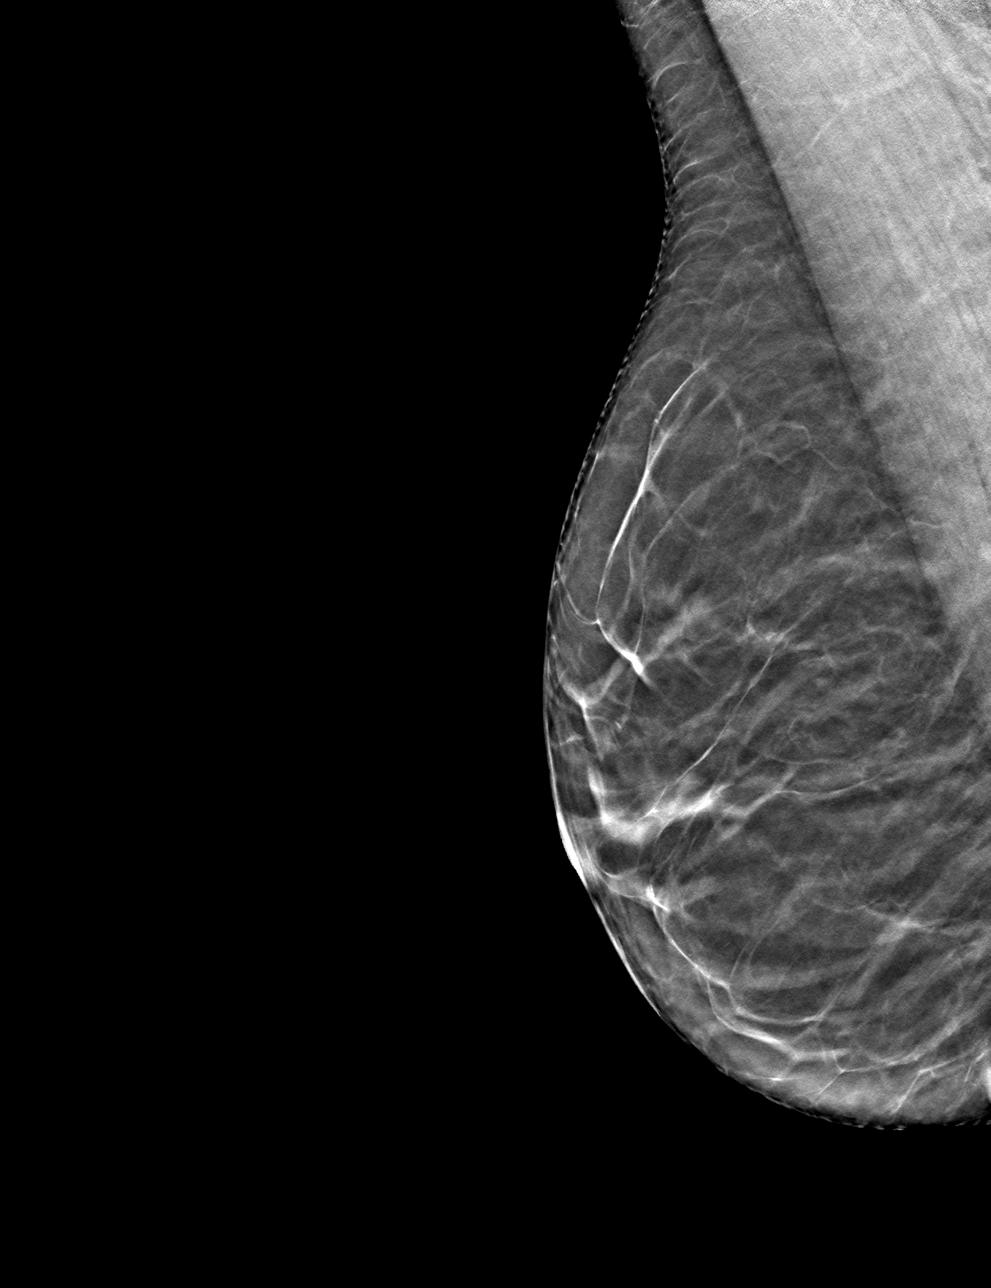

[4 of 12 positions shown; findings below may reference images not displayed]

ACR Breast Density Category b: There are scattered areas of
fibroglandular density.
FINDINGS: There are no findings suspicious for malignancy. Images were
processed with CAD.
IMPRESSION: No mammographic evidence of malignancy. A result letter of this
screening mammogram will be mailed directly to the patient.

RECOMMENDATION:
Screening mammogram in one year. (Code:97-6-RS4)

BI-RADS CATEGORY  1: Negative.

## 2018-04-22 ENCOUNTER — Telehealth: Payer: Self-pay | Admitting: Neurology

## 2018-04-22 NOTE — Telephone Encounter (Signed)
I tried calling pt back. Phone kept ringing, unable to leave message.

## 2018-04-22 NOTE — Telephone Encounter (Signed)
Per Dr. Epimenio Foot- patient will have to come in for an office visit first. It has been over a year since she has been seen.

## 2018-04-22 NOTE — Telephone Encounter (Signed)
Pt called stating she had stopped Adderall due to a recent back surgery but now that the pt is back at work she would like a call to discuss going back on amphetamine-dextroamphetamine (ADDERALL XR) 15 MG 24 hr capsule

## 2018-04-23 NOTE — Telephone Encounter (Signed)
I called pt. Scheduled her to f/u on 04/25/18 at 930am. Advised her to check in 9am. She verbalized understanding and appreciation.

## 2018-04-25 ENCOUNTER — Ambulatory Visit: Payer: 59 | Admitting: Neurology

## 2018-04-25 ENCOUNTER — Encounter: Payer: Self-pay | Admitting: Neurology

## 2018-04-25 ENCOUNTER — Other Ambulatory Visit: Payer: Self-pay

## 2018-04-25 VITALS — BP 146/93 | HR 90 | Ht 65.0 in | Wt 169.5 lb

## 2018-04-25 DIAGNOSIS — R4189 Other symptoms and signs involving cognitive functions and awareness: Secondary | ICD-10-CM

## 2018-04-25 DIAGNOSIS — R5383 Other fatigue: Secondary | ICD-10-CM

## 2018-04-25 DIAGNOSIS — F418 Other specified anxiety disorders: Secondary | ICD-10-CM

## 2018-04-25 DIAGNOSIS — G3184 Mild cognitive impairment, so stated: Secondary | ICD-10-CM

## 2018-04-25 DIAGNOSIS — G35 Multiple sclerosis: Secondary | ICD-10-CM | POA: Diagnosis not present

## 2018-04-25 DIAGNOSIS — R269 Unspecified abnormalities of gait and mobility: Secondary | ICD-10-CM | POA: Diagnosis not present

## 2018-04-25 MED ORDER — GABAPENTIN 300 MG PO CAPS: ORAL_CAPSULE | ORAL | 4 refills | Status: DC

## 2018-04-25 MED ORDER — AMPHETAMINE-DEXTROAMPHET ER 15 MG PO CP24: 15.0000 mg | ORAL_CAPSULE | ORAL | 0 refills | Status: DC

## 2018-04-25 NOTE — Progress Notes (Signed)
[facility]  PATIENT: Diane Carson DOB: 1966-06-03  REFERRING DOCTOR OR PCP:  Referred by Dr. Stacy Gardner. Her PCP is Guerry Bruin. SOURCE: Patient, notes from Dr. Anne Hahn, MRI and lab reports, MRI images on PACS.  _________________________________   HISTORICAL  CHIEF COMPLAINT:  Chief Complaint  Patient presents with  . Follow-up    RM 12, alone. Last seen 04/20/17. She reports some vision changes, blurry.  She plans to go to eye doctor.   . Multiple Sclerosis    Takes Tecfidera. No issues.     HISTORY OF PRESENT ILLNESS:   Diane Carson is a 52 y.o. woman with MS.  Update 04/25/2018: She feels her MS is mostly stable.   Last MRI 05/16/17 was stable with no new MS lesions.  She is on Tecfidera and she tolerates it well--no longer has flushing.   Gait is ok, she is walking less since her surgery but feels it is stable     Leg strength is about the same in her legs.   She has been less able to exercise.  Sometimes she gets dysesthesias in her legs.    She has occasional stress and urge incontinence.    This is a new issue the past couple months,      She has mild fatigue, usually only late in the day after working a longer day.   She notes decreased focus and attention and Adderall had helped her a lot.   Having more stress and depression.   She is going through a divorce.  She is seeing psychiatry and Lexapro was added.   She has had two lumbar operations (L5-S1) and is doing better.    She also had SI joint injections recently.   egs Update 04/20/2017:    She feels her MS is mostly doing well. She is on Tecfidera. She gets occasional flushing but otherwise tolerates it well. There has not been any exacerbations. Her gait is doing about the same. She will stumble but has not had any major falls. At times right leg will give out, especially if she goes longer distances. There are some dysesthetic sensations in her feet, helped by gabapentin 300 mg po  tid.   She denies any major problems with her bladder. There is chronic constipation. In the past she had optic neuritis on the left sometimes she feels that the vision is slightly blurry.  Her biggest problem is fatigue. She was unable to tolerate Nuvigil as it made her feel "scattered and an old drive".  She tried half a pill but it was not better.   Initially she thought it was helping but she didn't like how she felt. She continues to have insomnia that is both sleep onset and sleep maintenance. Trazodone has helped this. She is on Lexapro for depression and anxiety.  Sometimes she feels overwhelmed (worse with brother having Stage 4 colon cancer).   She has difficulty with short-term memory, focus and attention and other cognitive tasks. Initially, Nuvigil was helping her with this but she stopped that she had trouble tolerating it.   _______________________________________ From 10/19/2016:  MS:   She was started on Tecfidera and tolerates it well.   She had a few episodes of flushing  But no problems the last month.   SHe has not had nay new MS symptoms or exacerbations.     Gait/strength/sensation:   She notes that her gait will get a little worse if she walks longer distances. She stumbles some  but does not fall. She notes that the right leg will give out. If she goes longer distance the right leg will become more tired. There are also dysesthetic sensations in both feet. She is walking on pebbles.   Bladder/bowel: She denies any difficulty with bladder urgency, frequency or hesitancy. She does not have nocturia. She has some constipation but this has been a long-term issue for her.  Vision: She notes blurry vision bilaterally.   She had ON on the left in the past.   Colors do not appear desaturated.  . She has seen her ophthalmologist within the last year. Told that there was not evidence of her prior optic neuritis.  Fatigue/sleep:   She has daily fatigue.   Fatigue did not improve as much  as mental fog did on the armodafinil.  Fatigue is milder in the morning and is worse later in the day, especially if she has had a more active day. Worse during warm weather or if she gets hot.   She has both sleep onset and sleep maintenance insomnia. Trazodone has helped the sleep onset and sleep maintenance issues.   Mood/cognition:   She reports both depression and anxiety. She has been on Lexapro with some benefit. It has helped her tearfulness and anxiety some but incompletely. She still has a lot of irritability and apathy. She gets frustrated easily. She notes cognitive difficulties that have progressed over the last couple of years. She ws having a lot of difficulty with recall and verbal fluency and parallel processing --- all better on Armodafinil.   .  MS History:    In 1999, she had left optic neuritis. She did not receive steroids at that time but did have an MRI of the brain that was reportedly normal. She did very well for the next 14 years. In 2013, she woke up with numbness in her feet the one day. Over the next 2 days numbness evolved to be more intense and be in the distribution up to the mid thoracic level.   She also had decreased balance and fell a couple times. She noted that the numbness was near total of the breast. She received several days of IV steroids and symptoms improved, but not completely to baseline.    Between 2013 and 2017,  she has had no definite exacerbation. However, she has noted more difficulty with cognitive function, emotional lability, and fatigue.    MRI of the brain and spine dated 06/07/2016 and compared with the MRI dated 08/28/2011.  The older MRI shows a couple small T2/FLAIR hyperintense foci in the deep white matter and white matter. On the current study, there are about 6 or 7 more foci in the hemispheres including more in the periventricular white matter and 2 foci in the right cerebellar hemisphere. The 2017 MRI of the cervical spine shows mild  degenerative changes (with foraminal narrowing to the left at C7T1) 5but a normal spinal cord. The 2017 MRI of the thoracic spine shows a chronic plaque at T7.  Also, there is a disc protrusion at T9-T10.     I also reviewed laboratory data from December 2017.The NMO Ab is negative.  Vitamin D is low normal.    REVIEW OF SYSTEMS: Constitutional: No fevers, chills, sweats, or change in appetite.   She has Fatigue Eyes: Notes occ blurred vision.  N0 double vision, eye pain Ear, nose and throat: No hearing loss, ear pain, nasal congestion, sore throat Cardiovascular: No chest pain, palpitations Respiratory: No  shortness of breath at rest or with exertion.   No wheezes GastrointestinaI: No nausea, vomiting, diarrhea, abdominal pain, fecal incontinence Genitourinary: No dysuria, urinary retention or frequency.  No nocturia. Musculoskeletal: No neck pain, back pain Integumentary: No rash, pruritus, skin lesions Neurological: as above Psychiatric: Notes some depression and anxiety.  She is going through a divorce,  Endocrine: No palpitations, diaphoresis, change in appetite, change in weigh or increased thirst Hematologic/Lymphatic: No anemia, purpura, petechiae. Allergic/Immunologic: No itchy/runny eyes, nasal congestion, recent allergic reactions, rashes  ALLERGIES: Allergies  Allergen Reactions  . Atorvastatin     Ringing of the ears, itching, heartburn  . Codeine Shortness Of Breath    HOME MEDICATIONS:  Current Outpatient Medications:  .  amphetamine-dextroamphetamine (ADDERALL XR) 15 MG 24 hr capsule, Take 1 capsule by mouth every morning. Must be seen prior to future refills., Disp: 30 capsule, Rfl: 0 .  calcium carbonate (OSCAL) 1500 (600 Ca) MG TABS tablet, Take 600 mg of elemental calcium by mouth 2 (two) times daily with a meal., Disp: , Rfl:  .  cholecalciferol (VITAMIN D) 1000 units tablet, Take 5,000 Units by mouth daily., Disp: , Rfl:  .  escitalopram (LEXAPRO) 20 MG  tablet, TAKE 1 TABLET(20 MG) BY MOUTH DAILY, Disp: , Rfl:  .  gabapentin (NEURONTIN) 300 MG capsule, Take one pill po qAM and 2 pills po qHS, Disp: 270 capsule, Rfl: 4 .  meloxicam (MOBIC) 15 MG tablet, TAKE 1 TABLET(15 MG) BY MOUTH DAILY, Disp: , Rfl:  .  Multiple Vitamin (MULTIVITAMIN) tablet, Take 1 tablet by mouth daily., Disp: , Rfl:  .  TECFIDERA 240 MG CPDR, TAKE 1 CAPSULE BY MOUTH TWICE DAILY, Disp: 60 capsule, Rfl: 11 .  dalfampridine 10 MG TB12, Take 1 tablet (10 mg total) 2 (two) times daily by mouth. (Patient not taking: Reported on 04/25/2018), Disp: 60 tablet, Rfl: 3 No current facility-administered medications for this visit.   Facility-Administered Medications Ordered in Other Visits:  .  gadopentetate dimeglumine (MAGNEVIST) injection 13 mL, 13 mL, Intravenous, Once PRN, Sater, Pearletha Furl, MD  PAST MEDICAL HISTORY: Past Medical History:  Diagnosis Date  . Degenerative disc disease, cervical   . Multiple sclerosis (HCC)   . Osteoarthritis    thumbs  . Osteoporosis   . Vision abnormalities     PAST SURGICAL HISTORY: Past Surgical History:  Procedure Laterality Date  . BREAST BIOPSY Right    Biopsy over 20 years ago, done in [place]  . JOINT REPLACEMENT Right 09/07/2016   thumb    FAMILY HISTORY: Family History  Adopted: Yes  Problem Relation Age of Onset  . Colon cancer Brother     SOCIAL HISTORY:  Social History   Socioeconomic History  . Marital status: Married    Spouse name: Not on file  . Number of children: Not on file  . Years of education: Not on file  . Highest education level: Not on file  Occupational History  . Not on file  Social Needs  . Financial resource strain: Not on file  . Food insecurity:    Worry: Not on file    Inability: Not on file  . Transportation needs:    Medical: Not on file    Non-medical: Not on file  Tobacco Use  . Smoking status: Former Games developer  . Smokeless tobacco: Never Used  Substance and Sexual Activity   . Alcohol use: No  . Drug use: Yes    Types: Marijuana    Comment: daily marijuana  use  . Sexual activity: Not on file  Lifestyle  . Physical activity:    Days per week: Not on file    Minutes per session: Not on file  . Stress: Not on file  Relationships  . Social connections:    Talks on phone: Not on file    Gets together: Not on file    Attends religious service: Not on file    Active member of club or organization: Not on file    Attends meetings of clubs or organizations: Not on file    Relationship status: Not on file  . Intimate partner violence:    Fear of current or ex partner: Not on file    Emotionally abused: Not on file    Physically abused: Not on file    Forced sexual activity: Not on file  Other Topics Concern  . Not on file  Social History Narrative  . Not on file     PHYSICAL EXAM  Vitals:   04/25/18 0916  BP: (!) 146/93  Pulse: 90  Weight: 169 lb 8 oz (76.9 kg)  Height: 5\' 5"  (1.651 m)    Body mass index is 28.21 kg/m.   General: The patient is well-developed and well-nourished and in no acute distress    Neurologic Exam  Mental status: The patient is alert and oriented x 3 at the time of the examination. The patient has apparent normal recent and remote memory, with an apparently normal attention span and concentration ability.   Speech is normal.  Cranial nerves: Extraocular movements are full. Facial strength and sensation are normal.  Trapezius and sternocleidomastoid strength is normal. No dysarthria is noted.  The tongue is midline, and the patient has symmetric elevation of the soft palate. No obvious hearing deficits are noted.  Motor:  Muscle bulk is normal.   Tone is normal. Strength is  5 / 5 in all 4 extremities except 4+/5 right foot  Sensory: Symmetric touch and vibration sensation.  Coordination: Cerebellar testing reveals good finger-nose-finger and heel-to-shin bilaterally  Gait and station: Station is normal.   Gait  shows mild right foot drop but is better than last visit. Tandem gait is mildy wide.  Romberg is negative.   Reflexes: Deep tendon reflexes are symmetric and normal in arms.   She had increased deep tendon reflexes in the legs and there was spread at the knees.   25 foot timed walk is 6.4 sec (average of 2 trials)    DIAGNOSTIC DATA (LABS, IMAGING, TESTING) - I reviewed patient records, labs, notes, testing and imaging myself where available.      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   She will continue Tecfidera. Check CBC/Diff 2.    Stay active and exercise as tolerated. 3.   Adderall for fatigue and reduced attention. 4.    She will return to see me in 5-6 months or sooner if there are new or worsening neurologic symptoms.  Richard A. Epimenio Foot, MD, PhD 04/25/2018, 9:36 AM Certified in Neurology, Clinical Neurophysiology, Sleep Medicine, Pain Medicine and Neuroimaging  [facility] [address], [address] [place], [place] [address] [phone]

## 2018-04-26 LAB — CBC WITH DIFFERENTIAL/PLATELET
BASOS: 1 %
Basophils Absolute: 0.1 10*3/uL (ref 0.0–0.2)
EOS (ABSOLUTE): 0.1 10*3/uL (ref 0.0–0.4)
EOS: 1 %
HEMATOCRIT: 40.5 % (ref 34.0–46.6)
Hemoglobin: 13.2 g/dL (ref 11.1–15.9)
IMMATURE GRANULOCYTES: 0 %
Immature Grans (Abs): 0 10*3/uL (ref 0.0–0.1)
LYMPHS ABS: 2.3 10*3/uL (ref 0.7–3.1)
Lymphs: 38 %
MCH: 29.1 pg (ref 26.6–33.0)
MCHC: 32.6 g/dL (ref 31.5–35.7)
MCV: 89 fL (ref 79–97)
MONOS ABS: 0.7 10*3/uL (ref 0.1–0.9)
Monocytes: 12 %
NEUTROS PCT: 48 %
Neutrophils Absolute: 2.9 10*3/uL (ref 1.4–7.0)
PLATELETS: 412 10*3/uL (ref 150–450)
RBC: 4.53 x10E6/uL (ref 3.77–5.28)
RDW: 14 % (ref 12.3–15.4)
WBC: 6.1 10*3/uL (ref 3.4–10.8)

## 2018-04-29 ENCOUNTER — Telehealth: Payer: Self-pay

## 2018-04-29 NOTE — Telephone Encounter (Signed)
-----   Message from Hillis Range, RN sent at 04/29/2018  8:09 AM EDT -----   ----- Message ----- From: Asa Lente, MD Sent: 04/26/2018   2:25 PM EDT To: Magnus Sinning Misenheimer, RN  Please let the patient know that the lab work is fine.

## 2018-04-29 NOTE — Telephone Encounter (Signed)
Advised patient labs were fine per previous message.

## 2018-04-29 NOTE — Telephone Encounter (Signed)
I called pt, no answer, left a message at her cell phone voicemail per DPR that her labs were fine per Dr. Epimenio Foot and asked her to call us back with any questions or concerns.

## 2018-05-28 ENCOUNTER — Other Ambulatory Visit: Payer: Self-pay | Admitting: Neurology

## 2018-05-28 MED ORDER — AMPHETAMINE-DEXTROAMPHET ER 15 MG PO CP24: 15.0000 mg | ORAL_CAPSULE | ORAL | 0 refills | Status: DC

## 2018-05-28 NOTE — Telephone Encounter (Signed)
Pt requesting refills for amphetamine-dextroamphetamine (ADDERALL XR) 15 MG 24 hr capsule sent to [facility]  °

## 2018-06-27 ENCOUNTER — Other Ambulatory Visit: Payer: Self-pay | Admitting: Neurology

## 2018-06-27 MED ORDER — AMPHETAMINE-DEXTROAMPHET ER 15 MG PO CP24: 15.0000 mg | ORAL_CAPSULE | ORAL | 0 refills | Status: DC

## 2018-06-27 NOTE — Telephone Encounter (Signed)
Pt requesting refills for amphetamine-dextroamphetamine (ADDERALL XR) 15 MG 24 hr capsule sent to [facility] on [address]

## 2018-07-16 ENCOUNTER — Other Ambulatory Visit (HOSPITAL_BASED_OUTPATIENT_CLINIC_OR_DEPARTMENT_OTHER): Payer: Self-pay

## 2018-07-16 DIAGNOSIS — Z1231 Encounter for screening mammogram for malignant neoplasm of breast: Secondary | ICD-10-CM

## 2018-07-19 ENCOUNTER — Ambulatory Visit (HOSPITAL_BASED_OUTPATIENT_CLINIC_OR_DEPARTMENT_OTHER): Payer: 59

## 2018-07-20 ENCOUNTER — Ambulatory Visit (HOSPITAL_BASED_OUTPATIENT_CLINIC_OR_DEPARTMENT_OTHER): Payer: 59

## 2018-07-23 ENCOUNTER — Ambulatory Visit (HOSPITAL_BASED_OUTPATIENT_CLINIC_OR_DEPARTMENT_OTHER)
Admission: RE | Admit: 2018-07-23 | Discharge: 2018-07-23 | Disposition: A | Discharge status: Home / Self Care | Payer: 59 | Source: Ambulatory Visit | Attending: Diagnostic Radiology | Admitting: Diagnostic Radiology

## 2018-07-23 ENCOUNTER — Encounter (HOSPITAL_BASED_OUTPATIENT_CLINIC_OR_DEPARTMENT_OTHER): Payer: Self-pay

## 2018-07-23 DIAGNOSIS — Z1231 Encounter for screening mammogram for malignant neoplasm of breast: Secondary | ICD-10-CM | POA: Diagnosis not present

## 2018-07-30 ENCOUNTER — Other Ambulatory Visit: Payer: Self-pay | Admitting: Neurology

## 2018-07-30 MED ORDER — DIMETHYL FUMARATE 240 MG PO CPDR: 1.0000 | DELAYED_RELEASE_CAPSULE | Freq: Two times a day (BID) | ORAL | 3 refills | Status: DC

## 2018-07-30 MED ORDER — AMPHETAMINE-DEXTROAMPHET ER 15 MG PO CP24: 15.0000 mg | ORAL_CAPSULE | ORAL | 0 refills | Status: DC

## 2018-07-30 NOTE — Addendum Note (Signed)
Addended by: Candis Schatz I on: 07/30/2018 11:36 AM   Modules accepted: Orders

## 2018-07-30 NOTE — Addendum Note (Signed)
Addended by: Candis Schatz I on: 07/30/2018 11:34 AM   Modules accepted: Orders

## 2018-07-30 NOTE — Telephone Encounter (Signed)
Tecfidera escribed to [facility]. Will ask Dr. Epimenio Foot to send Adderall to [facility]

## 2018-07-30 NOTE — Telephone Encounter (Signed)
Pt has called for a refill on her  TECFIDERA 240 MG CPDR [facility]   and her  amphetamine-dextroamphetamine (ADDERALL XR) 15 MG 24 hr capsule [facility] [phone]

## 2018-08-02 ENCOUNTER — Ambulatory Visit: Payer: 59 | Admitting: Neurology

## 2018-09-02 ENCOUNTER — Other Ambulatory Visit: Payer: Self-pay | Admitting: Neurology

## 2018-09-02 MED ORDER — AMPHETAMINE-DEXTROAMPHET ER 15 MG PO CP24: 15.0000 mg | ORAL_CAPSULE | ORAL | 0 refills | Status: DC

## 2018-09-02 NOTE — Telephone Encounter (Signed)
Pt request refill for amphetamine-dextroamphetamine (ADDERALL XR) 15 MG 24 hr capsule sent to [facility] °

## 2018-10-09 ENCOUNTER — Other Ambulatory Visit: Payer: Self-pay | Admitting: Neurology

## 2018-10-09 MED ORDER — AMPHETAMINE-DEXTROAMPHET ER 15 MG PO CP24: 15.0000 mg | ORAL_CAPSULE | ORAL | 0 refills | Status: DC

## 2018-10-09 NOTE — Telephone Encounter (Signed)
Pt is needing a refill on her amphetamine-dextroamphetamine (ADDERALL XR) 15 MG 24 hr capsule sent to [facility] in [facility]

## 2018-10-16 ENCOUNTER — Telehealth: Payer: Self-pay | Admitting: *Deleted

## 2018-10-16 NOTE — Telephone Encounter (Signed)
I called pt. Advised her apt on 10/25/18 needed to be r/s since our office is closed on Friday's right now over covid-19 concerns. She was agreeable to do a virtual visit. I r/s appt to 10/21/18 at 11am with Dr. Epimenio Foot. Updated med list/pharamcy/allergy list on file.  Email: [email or web address] Advised her to call back if she does not receive the email.

## 2018-10-21 ENCOUNTER — Other Ambulatory Visit: Payer: Self-pay

## 2018-10-21 ENCOUNTER — Encounter: Payer: Self-pay | Admitting: Neurology

## 2018-10-21 ENCOUNTER — Ambulatory Visit (INDEPENDENT_AMBULATORY_CARE_PROVIDER_SITE_OTHER): Payer: 59 | Admitting: Neurology

## 2018-10-21 DIAGNOSIS — R5383 Other fatigue: Secondary | ICD-10-CM

## 2018-10-21 DIAGNOSIS — R269 Unspecified abnormalities of gait and mobility: Secondary | ICD-10-CM | POA: Diagnosis not present

## 2018-10-21 DIAGNOSIS — F418 Other specified anxiety disorders: Secondary | ICD-10-CM

## 2018-10-21 DIAGNOSIS — R208 Other disturbances of skin sensation: Secondary | ICD-10-CM | POA: Diagnosis not present

## 2018-10-21 DIAGNOSIS — G4700 Insomnia, unspecified: Secondary | ICD-10-CM

## 2018-10-21 DIAGNOSIS — G35 Multiple sclerosis: Secondary | ICD-10-CM | POA: Diagnosis not present

## 2018-10-21 MED ORDER — TIZANIDINE HCL 4 MG PO TABS: ORAL_TABLET | ORAL | 5 refills | Status: DC

## 2018-10-21 NOTE — Progress Notes (Signed)
[facility]  PATIENT: Diane Carson DOB: Sep 02, 1965  REFERRING DOCTOR OR PCP:  Referred by Dr. Stacy Gardner. Her PCP is Guerry Bruin. SOURCE: Patient, notes from Dr. Anne Hahn, MRI and lab reports, MRI images on PACS.  _________________________________   HISTORICAL  CHIEF COMPLAINT:  Chief Complaint  Patient presents with   Multiple Sclerosis    on Tecfidera    HISTORY OF PRESENT ILLNESS:   Diane Carson is a 53 y.o. woman with MS.  Update 10/21/18 Virtual Visit via Video Note I connected with@ on 10/21/18 at 11:00 AM EDT by a video enabled telemedicine application and verified that I am speaking with the correct person.  I discussed the limitations of evaluation and management by telemedicine and the availability of in person appointments. The patient expressed understanding and agreed to proceed.  History of Present Illness: She feels the MS is mostly stable. She is on Tecfidera and tolerates it well for the most part.   However, she has been having episodes of itching.   There is no definite exacerbation.  However, she had an episode lasting 10 days of feeling weaker in legs but this improved.    She also notes the left foot is cramping more and causing more pain.  She is getting spasms lasting 2-5 minutes.   Rubbing the leg and relaxing helps.  This occurs mostly when she lays down or sits a while and is worse at night.  She has left foot drop affecting her gait but she walks without a cane.  I have prescribed Ampyra earlier and she has 1 month of medication but she never started as she was scheduled for lumbar surgery and did not want to take the medication when she was unsure whether she would be able to know a possible benefit.  She did have a lumbar surgery and continues on gabapentin for chronic radicular pain.  She is noting more fatigue, especially physical and she feels wiped out doing some chores (even making a bed as example)..    She is  sleeping well.   Trazodone 50 mg helps.    She has also had back pain and was started on tizanidine.  She notes that the back pain improved on the dose of trazodone 4 mg 4 times a day.  Additionally the foot spasms improved though did not completely resolve.  We discussed that she can go up to a higher dose and I will call in a larger refill.  She is separated and was in isolation but now moved back in to her husband's.  She does have depression.   Lamotrigine was started and she takes 100 mg po nightly.   She is also on 30 mg Lexapro, up from 20 mg  She is taking 5000 units of Vit D daily   Observations/Objective: She is a well-developed well-nourished woman in no acute distress.  The head is normocephalic and atraumatic.  Sclera are anicteric.  Visible skin appears normal.  The neck has a good range of motion.  Pharynx and tongue have normal appearance.  She is alert and fully oriented with fluent speech and good attention, knowledge and memory.  Extraocular muscles are intact.  Facial strength is normal.  Palatal elevation and tongue protrusion are midline.  She appears to have normal strength in the arms.  Rapid alternating movements and finger-nose-finger are performed well.   Assessment and Plan: Gait disturbance  Multiple sclerosis (HCC) - Plan: MR BRAIN WO CONTRAST, MR CERVICAL SPINE WO CONTRAST  Other fatigue  Dysesthesia  Insomnia, unspecified type  Depression with anxiety   1.   She will continue Tecfidera for MS.  I will hold off on checking the CBC at this time to reduce exposure outside the home.  We will check an MRI of the brain and cervical spine to determine if she is having any subclinical progression.  If present, we need to consider a different disease modifying therapy. 2.   She can increase the tizanidine up to 6 pills a day and I sent in a new prescription. 3.   Stay active and exercise as tolerated.  Try to get outside for that every day even if in the yard.  We  discussed a trial of Ampyra.  She already has medication. 4.   New Lexapro and a multigene for depression.  If she feels more hypomania type spells, we can increase the lamotrigine  5.   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   Return to see me in 6 months or sooner if there are new or worsening neurologic symptoms.  We will see her sooner if the MRI shows changes and we need to consider a different disease modifying therapy.  Follow Up Instructions: I discussed the assessment and treatment plan with the patient. The patient was provided an opportunity to ask questions and all were answered. The patient agreed with the plan and demonstrated an understanding of the instructions.    The patient was advised to call back or seek an in-person evaluation if the symptoms worsen or if the condition fails to improve as anticipated.  I provided 30 minutes of non-face-to-face time during this encounter.  _________________________________ From previous visits: Update 04/25/2018: She feels her MS is mostly stable.   Last MRI 05/16/17 was stable with no new MS lesions.  She is on Tecfidera and she tolerates it well--no longer has flushing.   Gait is ok, she is walking less since her surgery but feels it is stable     Leg strength is about the same in her legs.   She has been less able to exercise.  Sometimes she gets dysesthesias in her legs.    She has occasional stress and urge incontinence.    This is a new issue the past couple months,      She has mild fatigue, usually only late in the day after working a longer day.   She notes decreased focus and attention and Adderall had helped her a lot.   Having more stress and depression.   She is going through a divorce.  She is seeing psychiatry and Lexapro was added.   She has had two lumbar operations (L5-S1) and is doing better.    She also had SI joint injections recently.   egs Update 04/20/2017:    She feels her MS is mostly doing well. She  is on Tecfidera. She gets occasional flushing but otherwise tolerates it well. There has not been any exacerbations. Her gait is doing about the same. She will stumble but has not had any major falls. At times right leg will give out, especially if she goes longer distances. There are some dysesthetic sensations in her feet, helped by gabapentin 300 mg po tid.   She denies any major problems with her bladder. There is chronic constipation. In the past she had optic neuritis on the left sometimes she feels that the vision is slightly blurry.  Her biggest problem is fatigue. She was unable to  tolerate Nuvigil as it made her feel "scattered and an old drive".  She tried half a pill but it was not better.   Initially she thought it was helping but she didn't like how she felt. She continues to have insomnia that is both sleep onset and sleep maintenance. Trazodone has helped this. She is on Lexapro for depression and anxiety.  Sometimes she feels overwhelmed (worse with brother having Stage 4 colon cancer).   She has difficulty with short-term memory, focus and attention and other cognitive tasks. Initially, Nuvigil was helping her with this but she stopped that she had trouble tolerating it.   _______________________________________ From 10/19/2016:  MS:   She was started on Tecfidera and tolerates it well.   She had a few episodes of flushing  But no problems the last month.   SHe has not had nay new MS symptoms or exacerbations.     Gait/strength/sensation:   She notes that her gait will get a little worse if she walks longer distances. She stumbles some but does not fall. She notes that the right leg will give out. If she goes longer distance the right leg will become more tired. There are also dysesthetic sensations in both feet. She is walking on pebbles.   Bladder/bowel: She denies any difficulty with bladder urgency, frequency or hesitancy. She does not have nocturia. She has some constipation but  this has been a long-term issue for her.  Vision: She notes blurry vision bilaterally.   She had ON on the left in the past.   Colors do not appear desaturated.  . She has seen her ophthalmologist within the last year. Told that there was not evidence of her prior optic neuritis.  Fatigue/sleep:   She has daily fatigue.   Fatigue did not improve as much as mental fog did on the armodafinil.  Fatigue is milder in the morning and is worse later in the day, especially if she has had a more active day. Worse during warm weather or if she gets hot.   She has both sleep onset and sleep maintenance insomnia. Trazodone has helped the sleep onset and sleep maintenance issues.   Mood/cognition:   She reports both depression and anxiety. She has been on Lexapro with some benefit. It has helped her tearfulness and anxiety some but incompletely. She still has a lot of irritability and apathy. She gets frustrated easily. She notes cognitive difficulties that have progressed over the last couple of years. She ws having a lot of difficulty with recall and verbal fluency and parallel processing --- all better on Armodafinil.   .  MS History:    In 1999, she had left optic neuritis. She did not receive steroids at that time but did have an MRI of the brain that was reportedly normal. She did very well for the next 14 years. In 2013, she woke up with numbness in her feet the one day. Over the next 2 days numbness evolved to be more intense and be in the distribution up to the mid thoracic level.   She also had decreased balance and fell a couple times. She noted that the numbness was near total of the breast. She received several days of IV steroids and symptoms improved, but not completely to baseline.    Between 2013 and 2017,  she has had no definite exacerbation. However, she has noted more difficulty with cognitive function, emotional lability, and fatigue.    MRI of the brain and spine  dated 06/07/2016 and compared with  the MRI dated 08/28/2011.  The older MRI shows a couple small T2/FLAIR hyperintense foci in the deep white matter and white matter. On the current study, there are about 6 or 7 more foci in the hemispheres including more in the periventricular white matter and 2 foci in the right cerebellar hemisphere. The 2017 MRI of the cervical spine shows mild degenerative changes (with foraminal narrowing to the left at C7T1) 5but a normal spinal cord. The 2017 MRI of the thoracic spine shows a chronic plaque at T7.  Also, there is a disc protrusion at T9-T10.     I also reviewed laboratory data from December 2017.The NMO Ab is negative.  Vitamin D is low normal.    REVIEW OF SYSTEMS: Constitutional: No fevers, chills, sweats, or change in appetite.   She has Fatigue Eyes: Notes occ blurred vision.  N0 double vision, eye pain Ear, nose and throat: No hearing loss, ear pain, nasal congestion, sore throat Cardiovascular: No chest pain, palpitations Respiratory: No shortness of breath at rest or with exertion.   No wheezes GastrointestinaI: No nausea, vomiting, diarrhea, abdominal pain, fecal incontinence Genitourinary: No dysuria, urinary retention or frequency.  No nocturia. Musculoskeletal: No neck pain, back pain Integumentary: No rash, pruritus, skin lesions Neurological: as above Psychiatric: Notes some depression and anxiety.  She is going through a divorce,  Endocrine: No palpitations, diaphoresis, change in appetite, change in weigh or increased thirst Hematologic/Lymphatic: No anemia, purpura, petechiae. Allergic/Immunologic: No itchy/runny eyes, nasal congestion, recent allergic reactions, rashes  ALLERGIES: Allergies  Allergen Reactions   Atorvastatin     Ringing of the ears, itching, heartburn   Codeine Shortness Of Breath    HOME MEDICATIONS:  Current Outpatient Medications:    alendronate (FOSAMAX) 70 MG tablet, Take 70 mg by mouth once a week. , Disp: , Rfl:     amphetamine-dextroamphetamine (ADDERALL XR) 15 MG 24 hr capsule, Take 1 capsule by mouth every morning., Disp: 30 capsule, Rfl: 0   calcium carbonate (OSCAL) 1500 (600 Ca) MG TABS tablet, Take 600 mg of elemental calcium by mouth 2 (two) times daily with a meal., Disp: , Rfl:    cholecalciferol (VITAMIN D) 1000 units tablet, Take 5,000 Units by mouth daily., Disp: , Rfl:    dalfampridine 10 MG TB12, Take 1 tablet (10 mg total) 2 (two) times daily by mouth. (Patient not taking: Reported on 04/25/2018), Disp: 60 tablet, Rfl: 3   Dimethyl Fumarate (TECFIDERA) 240 MG CPDR, Take 1 capsule (240 mg total) by mouth 2 (two) times daily., Disp: 180 capsule, Rfl: 3   escitalopram (LEXAPRO) 20 MG tablet, TAKE 1 TABLET(20 MG) BY MOUTH DAILY, Disp: , Rfl:    gabapentin (NEURONTIN) 300 MG capsule, Take one pill po qAM and 2 pills po qHS, Disp: 270 capsule, Rfl: 4   lamoTRIgine (LAMICTAL) 100 MG tablet, Take 1 tablet by mouth daily., Disp: , Rfl:    meloxicam (MOBIC) 15 MG tablet, TAKE 1 TABLET(15 MG) BY MOUTH DAILY, Disp: , Rfl:    Multiple Vitamin (MULTIVITAMIN) tablet, Take 1 tablet by mouth daily., Disp: , Rfl:    tiZANidine (ZANAFLEX) 4 MG tablet, Take 2 pills three times a day, Disp: 180 tablet, Rfl: 5   traZODone (DESYREL) 50 MG tablet, Take 50 mg by mouth 2 (two) times daily at 8 am and 10 pm., Disp: , Rfl:  No current facility-administered medications for this visit.   Facility-Administered Medications Ordered in Other Visits:  gadopentetate dimeglumine (MAGNEVIST) injection 13 mL, 13 mL, Intravenous, Once PRN, Mendel Binsfeld, Pearletha Furl, MD  PAST MEDICAL HISTORY: Past Medical History:  Diagnosis Date   Degenerative disc disease, cervical    Multiple sclerosis (HCC)    Osteoarthritis    thumbs   Osteoporosis    Vision abnormalities     PAST SURGICAL HISTORY: Past Surgical History:  Procedure Laterality Date   BREAST BIOPSY Right    Biopsy over 20 years ago, done in [place]    JOINT REPLACEMENT Right 09/07/2016   thumb    FAMILY HISTORY: Family History  Adopted: Yes  Problem Relation Age of Onset   Colon cancer Brother     SOCIAL HISTORY:  Social History   Socioeconomic History   Marital status: Married    Spouse name: Not on file   Number of children: Not on file   Years of education: Not on file   Highest education level: Not on file  Occupational History   Not on file  Social Needs   Financial resource strain: Not on file   Food insecurity:    Worry: Not on file    Inability: Not on file   Transportation needs:    Medical: Not on file    Non-medical: Not on file  Tobacco Use   Smoking status: Former Smoker   Smokeless tobacco: Never Used  Substance and Sexual Activity   Alcohol use: No   Drug use: Yes    Types: Marijuana    Comment: daily marijuana use   Sexual activity: Not on file  Lifestyle   Physical activity:    Days per week: Not on file    Minutes per session: Not on file   Stress: Not on file  Relationships   Social connections:    Talks on phone: Not on file    Gets together: Not on file    Attends religious service: Not on file    Active member of club or organization: Not on file    Attends meetings of clubs or organizations: Not on file    Relationship status: Not on file   Intimate partner violence:    Fear of current or ex partner: Not on file    Emotionally abused: Not on file    Physically abused: Not on file    Forced sexual activity: Not on file  Other Topics Concern   Not on file  Social History Narrative   Not on file     PHYSICAL EXAM  There were no vitals filed for this visit.  There is no height or weight on file to calculate BMI.   General: The patient is well-developed and well-nourished and in no acute distress    Neurologic Exam  Mental status: The patient is alert and oriented x 3 at the time of the examination. The patient has apparent normal recent and remote  memory, with an apparently normal attention span and concentration ability.   Speech is normal.  Cranial nerves: Extraocular movements are full. Facial strength and sensation are normal.  Trapezius and sternocleidomastoid strength is normal. No dysarthria is noted.  The tongue is midline, and the patient has symmetric elevation of the soft palate. No obvious hearing deficits are noted.  Motor:  Muscle bulk is normal.   Tone is normal. Strength is  5 / 5 in all 4 extremities except 4+/5 right foot  Sensory: Symmetric touch and vibration sensation.  Coordination: Cerebellar testing reveals good finger-nose-finger and heel-to-shin bilaterally  Gait and  station: Station is normal.   Gait shows mild right foot drop but is better than last visit. Tandem gait is mildy wide.  Romberg is negative.   Reflexes: Deep tendon reflexes are symmetric and normal in arms.   She had increased deep tendon reflexes in the legs and there was spread at the knees.   25 foot timed walk is 6.4 sec (average of 2 trials)    DIAGNOSTIC DATA (LABS, IMAGING, TESTING) - I reviewed patient records, labs, notes, testing and imaging myself where available.      ASSESSMENT AND PLAN  Gait disturbance  Multiple sclerosis (HCC) - Plan: MR BRAIN WO CONTRAST, MR CERVICAL SPINE WO CONTRAST  Other fatigue  Dysesthesia  Insomnia, unspecified type  Depression with anxiety   Kimber Fritts A. Epimenio Foot, MD, PhD 10/21/2018, 12:39 PM Certified in Neurology, Clinical Neurophysiology, Sleep Medicine, Pain Medicine and Neuroimaging  [facility] [address], [address] [place], [place] [address] [phone]

## 2018-10-23 ENCOUNTER — Telehealth: Payer: Self-pay | Admitting: Neurology

## 2018-10-23 NOTE — Telephone Encounter (Signed)
[employer] Auth: Brain: [id number]  Cervical: [id number] (exp. 10/23/18 to 12/07/18) order sent to GI. They will reach out to the patient to schedule.

## 2018-10-25 ENCOUNTER — Ambulatory Visit: Payer: 59 | Admitting: Neurology

## 2018-10-30 ENCOUNTER — Telehealth: Payer: Self-pay | Admitting: Neurology

## 2018-10-30 NOTE — Telephone Encounter (Signed)
Called, LVM for pt relaying Dr. Sater's message. Asked her to call back if she has further questions/concerns. 

## 2018-10-30 NOTE — Telephone Encounter (Signed)
Patient went to Dr. Neomia Dear Pain Mgt Doctor and he wants to do Nerve ablation  To her back . Dr. Anise Salvo wants to make Dr. Epimenio Foot aware of this.  Does Dr. Epimenio Foot agree please call back with approval.

## 2018-10-30 NOTE — Telephone Encounter (Signed)
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  This could be a source of pain that might improve with radiofrequency ablation.  It would be okay to give this a try to see if it would provide benefit

## 2018-10-30 NOTE — Telephone Encounter (Signed)
Dr. Sater- please advise 

## 2018-11-12 ENCOUNTER — Other Ambulatory Visit: Payer: Self-pay | Admitting: Neurology

## 2018-11-12 MED ORDER — AMPHETAMINE-DEXTROAMPHET ER 15 MG PO CP24: 15.0000 mg | ORAL_CAPSULE | ORAL | 0 refills | Status: DC

## 2018-11-12 NOTE — Telephone Encounter (Signed)
Pt has called for a refill on her amphetamine-dextroamphetamine (ADDERALL XR) 15 MG 24 hr capsule [facility] [phone]

## 2018-11-12 NOTE — Addendum Note (Signed)
Addended by: Hillis Range on: 11/12/2018 09:16 AM   Modules accepted: Orders

## 2018-12-10 ENCOUNTER — Other Ambulatory Visit: Payer: Self-pay | Admitting: Neurology

## 2018-12-10 ENCOUNTER — Telehealth: Payer: Self-pay | Admitting: Neurology

## 2018-12-10 MED ORDER — AMPHETAMINE-DEXTROAMPHET ER 15 MG PO CP24: 15.0000 mg | ORAL_CAPSULE | ORAL | 0 refills | Status: DC

## 2018-12-10 NOTE — Telephone Encounter (Signed)
There was an authorization on file that expired on 12/07/18 and she never had the MRI's.  imaging attempted to reach out to her three times. ON 10/28/18, 11/05/18 & 11/12/18.  The MRI's now require a new authorization since the old one has expired.. They are pending I faxed the clinical notes.

## 2018-12-10 NOTE — Telephone Encounter (Signed)
Pt is calling in for a refill of amphetamine-dextroamphetamine (ADDERALL XR) 15 MG 24 hr capsule to be sent to [facility] [address] - [place], [place] - [address]

## 2018-12-10 NOTE — Telephone Encounter (Signed)
Pt is calling in stating she needs to get a MRI scheduled  And order faxed to [place] [facility].   Fax# [phone]

## 2018-12-11 NOTE — Telephone Encounter (Signed)
Updated [employer] Auth: MRI Brain Auth: [id number] (exp. 12/11/18 to 01/25/19) MRI Cervical Auth: [id number] (exp. 12/10/18 to 01/24/19)  Order faxed to [facility] fax # [phone] & phone # [phone].

## 2018-12-13 ENCOUNTER — Telehealth: Payer: Self-pay | Admitting: Neurology

## 2018-12-20 NOTE — Telephone Encounter (Signed)
error 

## 2019-01-09 ENCOUNTER — Other Ambulatory Visit: Payer: Self-pay | Admitting: Neurology

## 2019-01-09 MED ORDER — AMPHETAMINE-DEXTROAMPHET ER 15 MG PO CP24: 15.0000 mg | ORAL_CAPSULE | ORAL | 0 refills | Status: DC

## 2019-01-09 NOTE — Addendum Note (Signed)
Addended by: Hope Pigeon on: 01/09/2019 03:45 PM   Modules accepted: Orders

## 2019-01-09 NOTE — Telephone Encounter (Signed)
Pt has called for a refill on her amphetamine-dextroamphetamine (ADDERALL XR) 15 MG 24 hr capsule  [facility] [phone]  Pt has also asked to be called once the MRI results are available

## 2019-01-09 NOTE — Telephone Encounter (Signed)
Checked drug registry. She last refilled 12/13/18 #30. Post dated to fill on or after 01/12/19. Last seen 10/21/18 and next f/u 04/23/19.

## 2019-01-28 ENCOUNTER — Telehealth: Payer: Self-pay | Admitting: Neurology

## 2019-01-28 NOTE — Telephone Encounter (Signed)
Pt is asking for a call with the results to her MRI 

## 2019-02-03 ENCOUNTER — Telehealth: Payer: Self-pay | Admitting: Neurology

## 2019-02-03 NOTE — Telephone Encounter (Signed)
MRI of the brain showed one small new right frontal white matter focus compared to MRI 2018  MRI cervical spine showed a very small dot in the spinal cord at C6C7 (left posterolateral), not clearly present in 2017 but also seen on the 2013 MRi --- artifact vs stable tiny focus  She tolerates Tecfidera.  As only minimal change, she will stay on the medication.  Will reconsider if any relapses

## 2019-02-13 ENCOUNTER — Other Ambulatory Visit: Payer: Self-pay | Admitting: Neurology

## 2019-02-13 MED ORDER — AMPHETAMINE-DEXTROAMPHET ER 15 MG PO CP24: 15.0000 mg | ORAL_CAPSULE | ORAL | 0 refills | Status: DC

## 2019-02-13 NOTE — Telephone Encounter (Signed)
Pt is requesting a refill of amphetamine-dextroamphetamine (ADDERALL XR) 15 MG 24 hr capsule , to be sent to [facility] [address] - [place], [place] - [address]

## 2019-03-03 ENCOUNTER — Telehealth: Payer: Self-pay | Admitting: *Deleted

## 2019-03-03 NOTE — Telephone Encounter (Signed)
Faxed completed/signed med refill request for lamotrigine 100mg  tablet #90, 4 refills [facility] at [phone]. Received fax confirmation. Directions: Take 1 tablet by mouth daily

## 2019-03-19 ENCOUNTER — Other Ambulatory Visit: Payer: Self-pay | Admitting: *Deleted

## 2019-03-19 DIAGNOSIS — G35 Multiple sclerosis: Secondary | ICD-10-CM

## 2019-03-19 MED ORDER — TECFIDERA 240 MG PO CPDR: 1.0000 | DELAYED_RELEASE_CAPSULE | Freq: Two times a day (BID) | ORAL | 3 refills | Status: DC

## 2019-04-09 ENCOUNTER — Other Ambulatory Visit: Payer: Self-pay | Admitting: Neurology

## 2019-04-09 MED ORDER — AMPHETAMINE-DEXTROAMPHET ER 15 MG PO CP24: 15.0000 mg | ORAL_CAPSULE | ORAL | 0 refills | Status: DC

## 2019-04-09 NOTE — Telephone Encounter (Signed)
Pt called needing a refill on her amphetamine-dextroamphetamine (ADDERALL XR) 15 MG 24 hr capsule sent to a new pharmacy. Pt has switched to the [facility] in [place] on [address] [phone]

## 2019-04-09 NOTE — Telephone Encounter (Signed)
Checked drug registry. She last refilled 02/13/19 #30. Last seen 10/21/18 and next f/u 04/23/2019. She is not receiving rx from other physicians per drug registry.

## 2019-04-23 ENCOUNTER — Encounter: Payer: Self-pay | Admitting: Neurology

## 2019-04-23 ENCOUNTER — Other Ambulatory Visit: Payer: Self-pay

## 2019-04-23 ENCOUNTER — Ambulatory Visit (INDEPENDENT_AMBULATORY_CARE_PROVIDER_SITE_OTHER): Payer: 59 | Admitting: Neurology

## 2019-04-23 VITALS — BP 115/60 | HR 81 | Temp 98.3°F | Ht 65.0 in | Wt 144.2 lb

## 2019-04-23 DIAGNOSIS — G4700 Insomnia, unspecified: Secondary | ICD-10-CM

## 2019-04-23 DIAGNOSIS — G3184 Mild cognitive impairment, so stated: Secondary | ICD-10-CM

## 2019-04-23 DIAGNOSIS — G35 Multiple sclerosis: Secondary | ICD-10-CM | POA: Diagnosis not present

## 2019-04-23 DIAGNOSIS — R269 Unspecified abnormalities of gait and mobility: Secondary | ICD-10-CM

## 2019-04-23 DIAGNOSIS — R5383 Other fatigue: Secondary | ICD-10-CM | POA: Diagnosis not present

## 2019-04-23 DIAGNOSIS — R208 Other disturbances of skin sensation: Secondary | ICD-10-CM

## 2019-04-23 DIAGNOSIS — R4189 Other symptoms and signs involving cognitive functions and awareness: Secondary | ICD-10-CM

## 2019-04-23 MED ORDER — LAMOTRIGINE 100 MG PO TABS: 100.0000 mg | ORAL_TABLET | Freq: Two times a day (BID) | ORAL | 11 refills | Status: DC

## 2019-04-23 MED ORDER — ESCITALOPRAM OXALATE 20 MG PO TABS: ORAL_TABLET | ORAL | 11 refills | Status: DC

## 2019-04-23 NOTE — Progress Notes (Signed)
[facility]  PATIENT: Diane Carson DOB: 06-Mar-1966  REFERRING DOCTOR OR PCP:  Referred by Dr. Stacy Gardner. Her PCP is Guerry Bruin. SOURCE: Patient, notes from Dr. Anne Hahn, MRI and lab reports, MRI images on PACS.  _________________________________   HISTORICAL  CHIEF COMPLAINT:  Chief Complaint  Patient presents with   Follow-up    RM 13,alone. Last seen 10/21/2018. Wanting refill on lamotrigine  po qd, tizanidine  po (2 tablets po TID)   Multiple Sclerosis    On Tecfidera. Taking lexapro/lamotrigine for depression, adderall for fatigue/ADD. Tizanidine for back pain.     HISTORY OF PRESENT ILLNESS:   Diane Carson is a 52 y.o. woman with MS.  Update 04/23/2019: She is on Tecfidera and tolerates it well for the most part.  She denies any new exacerbations or major new symptoms.  Gait is mildly off balanced but no falls.   Strength is fine.  She has a lot of dysesthetic pain in the limbs.    She has some benefit from lamotrigine 100 mg daily and gabapentin 300 mg - 600 mg.    She has some spasms helped by tizanidine.  Meloxicam was changed to Celebrex for her lower back and leg pain.  She also has neck and hand pain.    She never took the dalfampridine.    Her vision is slightly blurry but stable.  She feels it is a bit worse when more tired.     Bladder is fine.   She has decreased focus/attention and fatigue.  Adderall has helped that.   Mood is doing well on escitalopram 45 mg and lamotrigine.   She has insomnia, helped by trazodone.   Mri's were reviewed:   MRI of the brain showed one small new right frontal white matter focus compared to MRI 2018.   MRI cervical spine showed a very small dot in the spinal cord at C6C7 (left posterolateral), not clearly present in 2017 but it was seen on the 2013 MRi --- artifact vs stable tiny focus  Update 10/21/18 (video) She feels the MS is mostly stable. She is on Tecfidera and tolerates it well  for the most part.   However, she has been having episodes of itching.   There is no definite exacerbation.  However, she had an episode lasting 10 days of feeling weaker in legs but this improved.    She also notes the left foot is cramping more and causing more pain.  She is getting spasms lasting 2-5 minutes.   Rubbing the leg and relaxing helps.  This occurs mostly when she lays down or sits a while and is worse at night.  She has left foot drop affecting her gait but she walks without a cane.  I have prescribed Ampyra earlier and she has 1 month of medication but she never started as she was scheduled for lumbar surgery and did not want to take the medication when she was unsure whether she would be able to know a possible benefit.  She did have a lumbar surgery and continues on gabapentin for chronic radicular pain.  She is noting more fatigue, especially physical and she feels wiped out doing some chores (even making a bed as example)..    She is sleeping well.   Trazodone 50 mg helps.    She has also had back pain and was started on tizanidine.  She notes that the back pain improved on the dose of trazodone 4 mg 4 times a day.  Additionally the foot spasms improved though did not completely resolve.  We discussed that she can go up to a higher dose and I will call in a larger refill.  She is separated and was in isolation but now moved back in to her husband's.  She does have depression.   Lamotrigine was started and she takes 100 mg po nightly.   She is also on 30 mg Lexapro, up from 20 mg  She is taking 5000 units of Vit D daily     Update 04/25/2018: She feels her MS is mostly stable.   Last MRI 05/16/17 was stable with no new MS lesions.  She is on Tecfidera and she tolerates it well--no longer has flushing.   Gait is ok, she is walking less since her surgery but feels it is stable     Leg strength is about the same in her legs.   She has been less able to exercise.  Sometimes she gets  dysesthesias in her legs.    She has occasional stress and urge incontinence.    This is a new issue the past couple months,      She has mild fatigue, usually only late in the day after working a longer day.   She notes decreased focus and attention and Adderall had helped her a lot.   Having more stress and depression.   She is going through a divorce.  She is seeing psychiatry and Lexapro was added.   She has had two lumbar operations (L5-S1) and is doing better.    She also had SI joint injections recently.   egs Update 04/20/2017:    She feels her MS is mostly doing well. She is on Tecfidera. She gets occasional flushing but otherwise tolerates it well. There has not been any exacerbations. Her gait is doing about the same. She will stumble but has not had any major falls. At times right leg will give out, especially if she goes longer distances. There are some dysesthetic sensations in her feet, helped by gabapentin 300 mg po tid.   She denies any major problems with her bladder. There is chronic constipation. In the past she had optic neuritis on the left sometimes she feels that the vision is slightly blurry.  Her biggest problem is fatigue. She was unable to tolerate Nuvigil as it made her feel "scattered and an old drive".  She tried half a pill but it was not better.   Initially she thought it was helping but she didn't like how she felt. She continues to have insomnia that is both sleep onset and sleep maintenance. Trazodone has helped this. She is on Lexapro for depression and anxiety.  Sometimes she feels overwhelmed (worse with brother having Stage 4 colon cancer).   She has difficulty with short-term memory, focus and attention and other cognitive tasks. Initially, Nuvigil was helping her with this but she stopped that she had trouble tolerating it.   _______________________________________ From 10/19/2016:  MS:   She was started on Tecfidera and tolerates it well.   She had a few  episodes of flushing  But no problems the last month.   SHe has not had nay new MS symptoms or exacerbations.     Gait/strength/sensation:   She notes that her gait will get a little worse if she walks longer distances. She stumbles some but does not fall. She notes that the right leg will give out. If she goes longer distance the right leg will  become more tired. There are also dysesthetic sensations in both feet. She is walking on pebbles.   Bladder/bowel: She denies any difficulty with bladder urgency, frequency or hesitancy. She does not have nocturia. She has some constipation but this has been a long-term issue for her.  Vision: She notes blurry vision bilaterally.   She had ON on the left in the past.   Colors do not appear desaturated.  . She has seen her ophthalmologist within the last year. Told that there was not evidence of her prior optic neuritis.  Fatigue/sleep:   She has daily fatigue.   Fatigue did not improve as much as mental fog did on the armodafinil.  Fatigue is milder in the morning and is worse later in the day, especially if she has had a more active day. Worse during warm weather or if she gets hot.   She has both sleep onset and sleep maintenance insomnia. Trazodone has helped the sleep onset and sleep maintenance issues.   Mood/cognition:   She reports both depression and anxiety. She has been on Lexapro with some benefit. It has helped her tearfulness and anxiety some but incompletely. She still has a lot of irritability and apathy. She gets frustrated easily. She notes cognitive difficulties that have progressed over the last couple of years. She ws having a lot of difficulty with recall and verbal fluency and parallel processing --- all better on Armodafinil.   .  MS History:    In 1999, she had left optic neuritis. She did not receive steroids at that time but did have an MRI of the brain that was reportedly normal. She did very well for the next 14 years. In 2013, she  woke up with numbness in her feet the one day. Over the next 2 days numbness evolved to be more intense and be in the distribution up to the mid thoracic level.   She also had decreased balance and fell a couple times. She noted that the numbness was near total of the breast. She received several days of IV steroids and symptoms improved, but not completely to baseline.    Between 2013 and 2017,  she has had no definite exacerbation. However, she has noted more difficulty with cognitive function, emotional lability, and fatigue.    MRI of the brain and spine dated 06/07/2016 and compared with the MRI dated 08/28/2011.  The older MRI shows a couple small T2/FLAIR hyperintense foci in the deep white matter and white matter. On the current study, there are about 6 or 7 more foci in the hemispheres including more in the periventricular white matter and 2 foci in the right cerebellar hemisphere. The 2017 MRI of the cervical spine shows mild degenerative changes (with foraminal narrowing to the left at C7T1) 5but a normal spinal cord. The 2017 MRI of the thoracic spine shows a chronic plaque at T7.  Also, there is a disc protrusion at T9-T10.     I also reviewed laboratory data from December 2017.The NMO Ab is negative.  Vitamin D is low normal.    REVIEW OF SYSTEMS: Constitutional: No fevers, chills, sweats, or change in appetite.   She has Fatigue Eyes: Notes occ blurred vision.  N0 double vision, eye pain Ear, nose and throat: No hearing loss, ear pain, nasal congestion, sore throat Cardiovascular: No chest pain, palpitations Respiratory: No shortness of breath at rest or with exertion.   No wheezes GastrointestinaI: No nausea, vomiting, diarrhea, abdominal pain, fecal incontinence Genitourinary: No  dysuria, urinary retention or frequency.  No nocturia. Musculoskeletal: No neck pain, back pain Integumentary: No rash, pruritus, skin lesions Neurological: as above Psychiatric: Notes some depression and  anxiety.  She is going through a divorce,  Endocrine: No palpitations, diaphoresis, change in appetite, change in weigh or increased thirst Hematologic/Lymphatic: No anemia, purpura, petechiae. Allergic/Immunologic: No itchy/runny eyes, nasal congestion, recent allergic reactions, rashes  ALLERGIES: Allergies  Allergen Reactions   Atorvastatin     Ringing of the ears, itching, heartburn   Codeine Shortness Of Breath    HOME MEDICATIONS:  Current Outpatient Medications:    alendronate (FOSAMAX) 70 MG tablet, Take 70 mg by mouth once a week. , Disp: , Rfl:    amphetamine-dextroamphetamine (ADDERALL XR) 15 MG 24 hr capsule, Take 1 capsule by mouth every morning., Disp: 30 capsule, Rfl: 0   calcium carbonate (OSCAL) 1500 (600 Ca) MG TABS tablet, Take 600 mg of elemental calcium by mouth 2 (two) times daily with a meal., Disp: , Rfl:    celecoxib (CELEBREX) 200 MG capsule, Take 200 mg by mouth 2 (two) times daily., Disp: , Rfl:    cholecalciferol (VITAMIN D) 1000 units tablet, Take 5,000 Units by mouth daily., Disp: , Rfl:    escitalopram (LEXAPRO) 20 MG tablet, TAKE 1.5 TABLETS(30 MG) BY MOUTH DAILY, Disp: 45 tablet, Rfl: 11   gabapentin (NEURONTIN) 300 MG capsule, Take one pill po qAM and 2 pills po qHS, Disp: 270 capsule, Rfl: 4   lamoTRIgine (LAMICTAL) 100 MG tablet, Take 1 tablet (100 mg total) by mouth 2 (two) times daily., Disp: 60 tablet, Rfl: 11   Multiple Vitamin (MULTIVITAMIN) tablet, Take 1 tablet by mouth daily., Disp: , Rfl:    TECFIDERA 240 MG CPDR, Take 1 capsule (240 mg total) by mouth 2 (two) times daily., Disp: 180 capsule, Rfl: 3   tiZANidine (ZANAFLEX) 4 MG tablet, Take 2 pills three times a day, Disp: 180 tablet, Rfl: 5   traZODone (DESYREL) 50 MG tablet, Take 50 mg by mouth 2 (two) times daily at 8 am and 10 pm., Disp: , Rfl:    dalfampridine 10 MG TB12, Take 1 tablet (10 mg total) 2 (two) times daily by mouth. (Patient not taking: Reported on  04/25/2018), Disp: 60 tablet, Rfl: 3 No current facility-administered medications for this visit.   Facility-Administered Medications Ordered in Other Visits:    gadopentetate dimeglumine (MAGNEVIST) injection 13 mL, 13 mL, Intravenous, Once PRN, Jazzman Loughmiller, Pearletha Furl, MD  PAST MEDICAL HISTORY: Past Medical History:  Diagnosis Date   Degenerative disc disease, cervical    Multiple sclerosis (HCC)    Osteoarthritis    thumbs   Osteoporosis    Vision abnormalities     PAST SURGICAL HISTORY: Past Surgical History:  Procedure Laterality Date   BREAST BIOPSY Right    Biopsy over 20 years ago, done in [place]   JOINT REPLACEMENT Right 09/07/2016   thumb    FAMILY HISTORY: Family History  Adopted: Yes  Problem Relation Age of Onset   Colon cancer Brother     SOCIAL HISTORY:  Social History   Socioeconomic History   Marital status: Married    Spouse name: Not on file   Number of children: Not on file   Years of education: Not on file   Highest education level: Not on file  Occupational History   Not on file  Social Needs   Financial resource strain: Not on file   Food insecurity    Worry: Not on  file    Inability: Not on file   Transportation needs    Medical: Not on file    Non-medical: Not on file  Tobacco Use   Smoking status: Former Smoker   Smokeless tobacco: Never Used  Substance and Sexual Activity   Alcohol use: No   Drug use: Yes    Types: Marijuana    Comment: daily marijuana use   Sexual activity: Not on file  Lifestyle   Physical activity    Days per week: Not on file    Minutes per session: Not on file   Stress: Not on file  Relationships   Social connections    Talks on phone: Not on file    Gets together: Not on file    Attends religious service: Not on file    Active member of club or organization: Not on file    Attends meetings of clubs or organizations: Not on file    Relationship status: Not on file    Intimate partner violence    Fear of current or ex partner: Not on file    Emotionally abused: Not on file    Physically abused: Not on file    Forced sexual activity: Not on file  Other Topics Concern   Not on file  Social History Narrative   Not on file     PHYSICAL EXAM  Vitals:   04/23/19 1133  BP: 115/60  Pulse: 81  Temp: 98.3 F (36.8 C)  SpO2: 98%  Weight: 144 lb 3.2 oz (65.4 kg)  Height: 5\' 5"  (1.651 m)    Body mass index is 24 kg/m.   General: The patient is well-developed and well-nourished and in no acute distress    Neurologic Exam  Mental status: The patient is alert and oriented x 3 at the time of the examination. The patient has apparent normal recent and remote memory, with an apparently normal attention span and concentration ability.   Speech is normal.  Cranial nerves: Extraocular movements are full. Facial strength and sensation are normal.  Trapezius and sternocleidomastoid strength is normal. No dysarthria is noted.  The tongue is midline, and the patient has symmetric elevation of the soft palate. No obvious hearing deficits are noted.  Motor:  Muscle bulk is normal.   Tone is normal. Strength is  5 / 5 in all 4 extremities except 4+/5 right foot  Sensory: Symmetric touch and vibration sensation.  Coordination: Cerebellar testing reveals good finger-nose-finger and heel-to-shin bilaterally  Gait and station: Station is normal.   She has a mild right foot drop.  The tandem gait is wide. .  Romberg is negative.   Reflexes: Deep tendon reflexes are symmetric and normal in arms.   She had increased deep tendon reflexes in the legs and there was spread at the knees.        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   Continue Tecfidera.  Check CBC with Diff.    MRI in June showed one small new focus and we will consider a switch if she experiences an exacerbation or her next MRI shows any new lesions. 2.   Continue medications for MS symptoms.   Increase lamotrigine to 100 mg po bid for dysesthesias 3.   F/u in 6 months, sooner if problems  Aundray Cartlidge A. Felecia Shelling, MD, PhD 04/23/2019,  5:51 PM Certified in Neurology, Clinical Neurophysiology, Sleep Medicine, Pain Medicine and Neuroimaging  [facility] [address], [address] [place], [place] [address] [phone]

## 2019-04-24 ENCOUNTER — Telehealth: Payer: Self-pay | Admitting: *Deleted

## 2019-04-24 LAB — CBC WITH DIFFERENTIAL/PLATELET
Basophils Absolute: 0.1 10*3/uL (ref 0.0–0.2)
Basos: 1 %
EOS (ABSOLUTE): 0.1 10*3/uL (ref 0.0–0.4)
Eos: 1 %
Hematocrit: 37.5 % (ref 34.0–46.6)
Hemoglobin: 12.4 g/dL (ref 11.1–15.9)
Immature Grans (Abs): 0 10*3/uL (ref 0.0–0.1)
Immature Granulocytes: 0 %
Lymphocytes Absolute: 2.6 10*3/uL (ref 0.7–3.1)
Lymphs: 34 %
MCH: 30 pg (ref 26.6–33.0)
MCHC: 33.1 g/dL (ref 31.5–35.7)
MCV: 91 fL (ref 79–97)
Monocytes Absolute: 0.7 10*3/uL (ref 0.1–0.9)
Monocytes: 9 %
Neutrophils Absolute: 4.3 10*3/uL (ref 1.4–7.0)
Neutrophils: 55 %
Platelets: 354 10*3/uL (ref 150–450)
RBC: 4.14 x10E6/uL (ref 3.77–5.28)
RDW: 12.9 % (ref 11.7–15.4)
WBC: 7.8 10*3/uL (ref 3.4–10.8)

## 2019-04-24 NOTE — Telephone Encounter (Signed)
-----   Message from Richard A Sater, MD sent at 04/24/2019 12:30 PM EDT ----- Please let the patient know that the lab work is fine.  

## 2019-04-24 NOTE — Telephone Encounter (Signed)
Called and spoke with pt. Relayed labs were fine per Dr. Felecia Shelling. She verbalized understanding.

## 2019-04-30 DIAGNOSIS — R61 Generalized hyperhidrosis: Secondary | ICD-10-CM | POA: Insufficient documentation

## 2019-05-02 ENCOUNTER — Other Ambulatory Visit: Payer: Self-pay | Admitting: Neurology

## 2019-05-02 NOTE — Telephone Encounter (Signed)
Pt called wanting to know the update on her tiZANidine (ZANAFLEX) 4 MG tablet refills. Please advise.

## 2019-05-05 ENCOUNTER — Other Ambulatory Visit: Payer: Self-pay | Admitting: *Deleted

## 2019-05-05 DIAGNOSIS — M7542 Impingement syndrome of left shoulder: Secondary | ICD-10-CM | POA: Insufficient documentation

## 2019-05-05 DIAGNOSIS — M5412 Radiculopathy, cervical region: Secondary | ICD-10-CM | POA: Insufficient documentation

## 2019-05-05 MED ORDER — TIZANIDINE HCL 4 MG PO TABS: ORAL_TABLET | ORAL | 5 refills | Status: DC

## 2019-05-05 MED ORDER — AMPHETAMINE-DEXTROAMPHET ER 15 MG PO CP24: 15.0000 mg | ORAL_CAPSULE | ORAL | 0 refills | Status: DC

## 2019-05-05 NOTE — Telephone Encounter (Addendum)
Called pt. She would like tizanidine refills to go to [facility] on file. I escribed this.  She also asked for refill on adderall. Checked drug registry. She last refilled rx 04/11/2019 #30. She last was seen 04/23/2019 and next f/u 10/22/2019

## 2019-05-27 ENCOUNTER — Ambulatory Visit: Payer: Self-pay | Admitting: Family Medicine

## 2019-06-09 ENCOUNTER — Other Ambulatory Visit: Payer: Self-pay | Admitting: Neurology

## 2019-06-09 MED ORDER — AMPHETAMINE-DEXTROAMPHET ER 15 MG PO CP24: 15.0000 mg | ORAL_CAPSULE | ORAL | 0 refills | Status: DC

## 2019-06-09 NOTE — Telephone Encounter (Signed)
Pt is asking if she can get a refill on her amphetamine-dextroamphetamine (ADDERALL XR) 15 MG 24 hr capsule [facility] [address] and if Dr Felecia Shelling will write her a script for trazadone since she doesn't have another Dr that could prescribe it.  Please call

## 2019-06-09 NOTE — Telephone Encounter (Addendum)
Checked drug registry. She last refilled adderall XR 15mg  05/13/2019 #30. Last seen 04/23/19 and next f/u 10/22/19.   I called pt. She is taking trazodone 50mg  1-2 tablets po qhs. She would have to make appt with PCP for them to refill. They were not who originally prescribed this for her. Wanting to know if Dr. Felecia Shelling will take over prescribing.

## 2019-06-09 NOTE — Addendum Note (Signed)
Addended by: Hope Pigeon on: 06/09/2019 04:37 PM   Modules accepted: Orders

## 2019-07-10 ENCOUNTER — Telehealth: Payer: Self-pay | Admitting: Neurology

## 2019-07-10 DIAGNOSIS — G35 Multiple sclerosis: Secondary | ICD-10-CM

## 2019-07-10 NOTE — Telephone Encounter (Signed)
[facility] called in and stated that the brand name for TECFIDERA 240 MG CPDR isnt coverd by insurance and wants to know if the generic brand can be sent in

## 2019-07-10 NOTE — Telephone Encounter (Signed)
Tried home number at [phone]. Number not in service. Tried mobile number at [phone]. VM full, cannot LVM.  Per Dr. Epimenio Foot, he is okay to switch her to generic Tecfidera: dimethyl fumarate. I wanted to discuss with her before switching.

## 2019-07-14 ENCOUNTER — Other Ambulatory Visit: Payer: Self-pay | Admitting: Neurology

## 2019-07-14 MED ORDER — AMPHETAMINE-DEXTROAMPHET ER 15 MG PO CP24: 15.0000 mg | ORAL_CAPSULE | ORAL | 0 refills | Status: DC

## 2019-07-14 MED ORDER — DIMETHYL FUMARATE 240 MG PO CPDR: 1.0000 | DELAYED_RELEASE_CAPSULE | Freq: Two times a day (BID) | ORAL | 3 refills | Status: DC

## 2019-07-14 NOTE — Telephone Encounter (Signed)
Pt last seen 04/23/2019 and next f/u 10/22/19. Checked drug registry, she last refilled 06/13/2019 #30.

## 2019-07-14 NOTE — Telephone Encounter (Signed)
Took call from phone staff and spoke with pt. She is agreeable to switch to generic Tecfidera: dimethyl fumarate. Aware I will send in new rx and PA most likely required. If copay high, she should ask about copay assistance. She verbalized understanding.

## 2019-07-14 NOTE — Telephone Encounter (Signed)
Pt is requesting a refill of amphetamine-dextroamphetamine (ADDERALL XR) 15 MG 24 hr capsule, to be sent to [facility] [address] [place], [place] - [address]

## 2019-07-15 NOTE — Telephone Encounter (Signed)
Submitted PA dimethyl fumarate on CMM.Key: [id number] - PA Case ID: [id number]. Waiting on determination from [facility].

## 2019-07-15 NOTE — Telephone Encounter (Signed)
PA approved: Request Reference Number: [id number]. DIMETHYL FUM CAP 240MG  DR is approved through 07/14/2020. For further questions, call [phone].

## 2019-08-13 ENCOUNTER — Other Ambulatory Visit: Payer: Self-pay | Admitting: Neurology

## 2019-08-13 MED ORDER — AMPHETAMINE-DEXTROAMPHET ER 15 MG PO CP24: 15.0000 mg | ORAL_CAPSULE | ORAL | 0 refills | Status: DC

## 2019-08-13 NOTE — Addendum Note (Signed)
Addended by: [place] Pink on: 08/13/2019 03:26 PM   Modules accepted: Orders

## 2019-08-13 NOTE — Telephone Encounter (Signed)
Pt has called for a refill on her amphetamine-dextroamphetamine (ADDERALL XR) 15 MG 24 hr capsule [facility]

## 2019-09-15 ENCOUNTER — Other Ambulatory Visit: Payer: Self-pay | Admitting: Neurology

## 2019-09-15 MED ORDER — AMPHETAMINE-DEXTROAMPHET ER 15 MG PO CP24: 15.0000 mg | ORAL_CAPSULE | ORAL | 0 refills | Status: DC

## 2019-09-15 NOTE — Telephone Encounter (Signed)
1) Medication(s) Requested (by name): adderall  2) Pharmacy of Choice: [facility] [address] [place], [place] - [address]  [address]

## 2019-09-16 DIAGNOSIS — R519 Headache, unspecified: Secondary | ICD-10-CM | POA: Insufficient documentation

## 2019-09-16 DIAGNOSIS — G8929 Other chronic pain: Secondary | ICD-10-CM | POA: Insufficient documentation

## 2019-09-16 DIAGNOSIS — M62831 Muscle spasm of calf: Secondary | ICD-10-CM | POA: Insufficient documentation

## 2019-10-20 ENCOUNTER — Other Ambulatory Visit: Payer: Self-pay | Admitting: Neurology

## 2019-10-20 MED ORDER — AMPHETAMINE-DEXTROAMPHET ER 15 MG PO CP24: 15.0000 mg | ORAL_CAPSULE | ORAL | 0 refills | Status: DC

## 2019-10-20 NOTE — Telephone Encounter (Signed)
1) Medication(s) Requested (by name): amphetamine-dextroamphetamine (ADDERALL XR) 15 MG 24 hr capsule   2) Pharmacy of Choice: [facility] [address] [place], [place] - [address]  [address], [place] [address]

## 2019-10-22 ENCOUNTER — Encounter: Payer: Self-pay | Admitting: Neurology

## 2019-10-22 ENCOUNTER — Other Ambulatory Visit: Payer: Self-pay

## 2019-10-22 ENCOUNTER — Ambulatory Visit: Payer: 59 | Admitting: Neurology

## 2019-10-22 VITALS — BP 115/77 | HR 96 | Temp 98.2°F | Wt 153.0 lb

## 2019-10-22 DIAGNOSIS — R269 Unspecified abnormalities of gait and mobility: Secondary | ICD-10-CM | POA: Diagnosis not present

## 2019-10-22 DIAGNOSIS — Z79899 Other long term (current) drug therapy: Secondary | ICD-10-CM | POA: Diagnosis not present

## 2019-10-22 DIAGNOSIS — G3184 Mild cognitive impairment, so stated: Secondary | ICD-10-CM

## 2019-10-22 DIAGNOSIS — F418 Other specified anxiety disorders: Secondary | ICD-10-CM

## 2019-10-22 DIAGNOSIS — G4700 Insomnia, unspecified: Secondary | ICD-10-CM

## 2019-10-22 DIAGNOSIS — G35 Multiple sclerosis: Secondary | ICD-10-CM

## 2019-10-22 DIAGNOSIS — R208 Other disturbances of skin sensation: Secondary | ICD-10-CM

## 2019-10-22 NOTE — Progress Notes (Signed)
[facility]  PATIENT: Diane Carson DOB: 01/04/1966  REFERRING DOCTOR OR PCP:  Referred by Dr. Stacy Gardner. Her PCP is Guerry Bruin. SOURCE: Patient, notes from Dr. Anne Hahn, MRI and lab reports, MRI images on PACS.  _________________________________   HISTORICAL  CHIEF COMPLAINT:  Chief Complaint  Patient presents with  . Follow-up    RM 12, alone. Last seen 04/23/2019.   . Multiple Sclerosis    Takes dimethyl fumarate. She may lose her insurance, she will know sometime today if she does. Current plan goes through end of April. If she loses it, she cannot afford insurance right now.     HISTORY OF PRESENT ILLNESS:   Diane Carson is a 54 y.o. 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  Update 10/22/2019: She feels her MS is doing well on generic Tecfidera.   She may be losing her insurance soon.   If so we may need to switch her for a while to [employer] to get free drug.   No new neurologic symptoms or exacerbations.     Gait is mildly off balanced but no falls.   Strength is fine.  She has dysesthetic pain in the limbs and also has sciatic type pain on her left..    She has some benefit from lamotrigine 100 mg daily and gabapentin 300 mg - 600 mg.    She gets spasms in hr legs helped by tizanidine.    Her vision is slightly blurry but stable.  She feels it is a bit worse when more tired.     Bladder is fine.   She has decreased focus/attention and fatigue.  Sometimes she has word finding errors or flips M and W's with writing.   Adderall has helped cognition some.   Mood is doing well on escitalopram 45 mg and lamotrigine.   She has sleep maintenance insomnia.   Trazodone was switched to amitriptyline to help pain more.     She sees pain management for her back and back.   She also gets acupuncture and does PT.   She has lumbar fusion in the past.    MRI of the brain July 2020 in [facility] showed one small new right frontal white matter focus compared to MRI 2018.   MRI  cervical spine showed a very small dot in the spinal cord at C6C7 (left posterolateral), not clearly present in 2017 but also seen on the 2013 MRi --- artifact vs stable tiny focus  Update 04/23/2019: She is on Tecfidera and tolerates it well for the most part.  She denies any new exacerbations or major new symptoms.  Gait is mildly off balanced but no falls.   Strength is fine.  She has a lot of dysesthetic pain in the limbs.    She has some benefit from lamotrigine 100 mg daily and gabapentin 300 mg - 600 mg.    She has some spasms helped by tizanidine.  Meloxicam was changed to Celebrex for her lower back and leg pain.  She also has neck and hand pain.    She never took the dalfampridine.    Her vision is slightly blurry but stable.  She feels it is a bit worse when more tired.     Bladder is fine.   She has decreased focus/attention and fatigue.  Adderall has helped that.   Mood is doing well on escitalopram 45 mg and lamotrigine.   She has insomnia, helped by trazodone.   Mri's were reviewed:   MRI of the  brain showed one small new right frontal white matter focus compared to MRI 2018.   MRI cervical spine showed a very small dot in the spinal cord at C6C7 (left posterolateral), not clearly present in 2017 but it was seen on the 2013 MRi --- artifact vs stable tiny focus  Update 10/21/18 (video) She feels the MS is mostly stable. She is on Tecfidera and tolerates it well for the most part.   However, she has been having episodes of itching.   There is no definite exacerbation.  However, she had an episode lasting 10 days of feeling weaker in legs but this improved.    She also notes the left foot is cramping more and causing more pain.  She is getting spasms lasting 2-5 minutes.   Rubbing the leg and relaxing helps.  This occurs mostly when she lays down or sits a while and is worse at night.  She has left foot drop affecting her gait but she walks without a cane.  I have prescribed Ampyra earlier  and she has 1 month of medication but she never started as she was scheduled for lumbar surgery and did not want to take the medication when she was unsure whether she would be able to know a possible benefit.  She did have a lumbar surgery and continues on gabapentin for chronic radicular pain.  She is noting more fatigue, especially physical and she feels wiped out doing some chores (even making a bed as example)..    She is sleeping well.   Trazodone 50 mg helps.    She has also had back pain and was started on tizanidine.  She notes that the back pain improved on the dose of trazodone 4 mg 4 times a day.  Additionally the foot spasms improved though did not completely resolve.  We discussed that she can go up to a higher dose and I will call in a larger refill.  She is separated and was in isolation but now moved back in to her husband's.  She does have depression.   Lamotrigine was started and she takes 100 mg po nightly.   She is also on 30 mg Lexapro, up from 20 mg  She is taking 5000 units of Vit D daily     Update 04/25/2018: She feels her MS is mostly stable.   Last MRI 05/16/17 was stable with no new MS lesions.  She is on Tecfidera and she tolerates it well--no longer has flushing.   Gait is ok, she is walking less since her surgery but feels it is stable     Leg strength is about the same in her legs.   She has been less able to exercise.  Sometimes she gets dysesthesias in her legs.    She has occasional stress and urge incontinence.    This is a new issue the past couple months,      She has mild fatigue, usually only late in the day after working a longer day.   She notes decreased focus and attention and Adderall had helped her a lot.   Having more stress and depression.   She is going through a divorce.  She is seeing psychiatry and Lexapro was added.   She has had two lumbar operations (L5-S1) and is doing better.    She also had SI joint injections recently.   egs Update  04/20/2017:    She feels her MS is mostly doing well. She is on Tecfidera. She  gets occasional flushing but otherwise tolerates it well. There has not been any exacerbations. Her gait is doing about the same. She will stumble but has not had any major falls. At times right leg will give out, especially if she goes longer distances. There are some dysesthetic sensations in her feet, helped by gabapentin 300 mg po tid.   She denies any major problems with her bladder. There is chronic constipation. In the past she had optic neuritis on the left sometimes she feels that the vision is slightly blurry.  Her biggest problem is fatigue. She was unable to tolerate Nuvigil as it made her feel "scattered and an old drive".  She tried half a pill but it was not better.   Initially she thought it was helping but she didn't like how she felt. She continues to have insomnia that is both sleep onset and sleep maintenance. Trazodone has helped this. She is on Lexapro for depression and anxiety.  Sometimes she feels overwhelmed (worse with brother having Stage 4 colon cancer).   She has difficulty with short-term memory, focus and attention and other cognitive tasks. Initially, Nuvigil was helping her with this but she stopped that she had trouble tolerating it.   _______________________________________ From 10/19/2016:  MS:   She was started on Tecfidera and tolerates it well.   She had a few episodes of flushing  But no problems the last month.   SHe has not had nay new MS symptoms or exacerbations.     Gait/strength/sensation:   She notes that her gait will get a little worse if she walks longer distances. She stumbles some but does not fall. She notes that the right leg will give out. If she goes longer distance the right leg will become more tired. There are also dysesthetic sensations in both feet. She is walking on pebbles.   Bladder/bowel: She denies any difficulty with bladder urgency, frequency or hesitancy. She  does not have nocturia. She has some constipation but this has been a long-term issue for her.  Vision: She notes blurry vision bilaterally.   She had ON on the left in the past.   Colors do not appear desaturated.  . She has seen her ophthalmologist within the last year. Told that there was not evidence of her prior optic neuritis.  Fatigue/sleep:   She has daily fatigue.   Fatigue did not improve as much as mental fog did on the armodafinil.  Fatigue is milder in the morning and is worse later in the day, especially if she has had a more active day. Worse during warm weather or if she gets hot.   She has both sleep onset and sleep maintenance insomnia. Trazodone has helped the sleep onset and sleep maintenance issues.   Mood/cognition:   She reports both depression and anxiety. She has been on Lexapro with some benefit. It has helped her tearfulness and anxiety some but incompletely. She still has a lot of irritability and apathy. She gets frustrated easily. She notes cognitive difficulties that have progressed over the last couple of years. She ws having a lot of difficulty with recall and verbal fluency and parallel processing --- all better on Armodafinil.   .  MS History:    In 1999, she had left optic neuritis. She did not receive steroids at that time but did have an MRI of the brain that was reportedly normal. She did very well for the next 14 years. In 2013, she woke up with numbness  in her feet the one day. Over the next 2 days numbness evolved to be more intense and be in the distribution up to the mid thoracic level.   She also had decreased balance and fell a couple times. She noted that the numbness was near total of the breast. She received several days of IV steroids and symptoms improved, but not completely to baseline.    Between 2013 and 2017,  she has had no definite exacerbation. However, she has noted more difficulty with cognitive function, emotional lability, and fatigue.    MRI of  the brain and spine dated 06/07/2016 and compared with the MRI dated 08/28/2011.  The older MRI shows a couple small T2/FLAIR hyperintense foci in the deep white matter and white matter. On the current study, there are about 6 or 7 more foci in the hemispheres including more in the periventricular white matter and 2 foci in the right cerebellar hemisphere. The 2017 MRI of the cervical spine shows mild degenerative changes (with foraminal narrowing to the left at C7T1) 5but a normal spinal cord. The 2017 MRI of the thoracic spine shows a chronic plaque at T7.  Also, there is a disc protrusion at T9-T10.     I also reviewed laboratory data from December 2017.The NMO Ab is negative.  Vitamin D is low normal.    REVIEW OF SYSTEMS: Constitutional: No fevers, chills, sweats, or change in appetite.   She has Fatigue Eyes: Notes occ blurred vision.  N0 double vision, eye pain Ear, nose and throat: No hearing loss, ear pain, nasal congestion, sore throat Cardiovascular: No chest pain, palpitations Respiratory: No shortness of breath at rest or with exertion.   No wheezes GastrointestinaI: No nausea, vomiting, diarrhea, abdominal pain, fecal incontinence Genitourinary: No dysuria, urinary retention or frequency.  No nocturia. Musculoskeletal: No neck pain, back pain Integumentary: No rash, pruritus, skin lesions Neurological: as above Psychiatric: Notes some depression and anxiety.  She is going through a divorce,  Endocrine: No palpitations, diaphoresis, change in appetite, change in weigh or increased thirst Hematologic/Lymphatic: No anemia, purpura, petechiae. Allergic/Immunologic: No itchy/runny eyes, nasal congestion, recent allergic reactions, rashes  ALLERGIES: Allergies  Allergen Reactions  . Atorvastatin     Ringing of the ears, itching, heartburn  . Codeine Shortness Of Breath    HOME MEDICATIONS:  Current Outpatient Medications:  .  alendronate (FOSAMAX) 70 MG tablet, Take 70 mg by  mouth once a week. , Disp: , Rfl:  .  amitriptyline (ELAVIL) 25 MG tablet, Take 25 mg by mouth at bedtime., Disp: , Rfl:  .  amphetamine-dextroamphetamine (ADDERALL XR) 15 MG 24 hr capsule, Take 1 capsule by mouth every morning., Disp: 30 capsule, Rfl: 0 .  calcium carbonate (OSCAL) 1500 (600 Ca) MG TABS tablet, Take 600 mg of elemental calcium by mouth 2 (two) times daily with a meal., Disp: , Rfl:  .  celecoxib (CELEBREX) 200 MG capsule, Take 200 mg by mouth 2 (two) times daily., Disp: , Rfl:  .  cholecalciferol (VITAMIN D) 1000 units tablet, Take 5,000 Units by mouth daily., Disp: , Rfl:  .  Dimethyl Fumarate (TECFIDERA) 240 MG CPDR, Take 1 capsule (240 mg total) by mouth 2 (two) times daily., Disp: 180 capsule, Rfl: 3 .  escitalopram (LEXAPRO) 20 MG tablet, TAKE 1.5 TABLETS(30 MG) BY MOUTH DAILY, Disp: 45 tablet, Rfl: 11 .  gabapentin (NEURONTIN) 300 MG capsule, Take one pill po qAM and 2 pills po qHS, Disp: 270 capsule, Rfl: 4 .  lamoTRIgine (LAMICTAL) 100 MG tablet, Take 1 tablet (100 mg total) by mouth 2 (two) times daily., Disp: 60 tablet, Rfl: 11 .  tiZANidine (ZANAFLEX) 4 MG tablet, Take 2 pills three times a day, Disp: 180 tablet, Rfl: 5 .  Multiple Vitamin (MULTIVITAMIN) tablet, Take 1 tablet by mouth daily., Disp: , Rfl:  No current facility-administered medications for this visit.  Facility-Administered Medications Ordered in Other Visits:  .  gadopentetate dimeglumine (MAGNEVIST) injection 13 mL, 13 mL, Intravenous, Once PRN, Jerod Mcquain, Pearletha Furl, MD  PAST MEDICAL HISTORY: Past Medical History:  Diagnosis Date  . Degenerative disc disease, cervical   . Multiple sclerosis (HCC)   . Osteoarthritis    thumbs  . Osteoporosis   . Vision abnormalities     PAST SURGICAL HISTORY: Past Surgical History:  Procedure Laterality Date  . BREAST BIOPSY Right    Biopsy over 20 years ago, done in [place]  . JOINT REPLACEMENT Right 09/07/2016   thumb    FAMILY HISTORY: Family  History  Adopted: Yes  Problem Relation Age of Onset  . Colon cancer Brother     SOCIAL HISTORY:  Social History   Socioeconomic History  . Marital status: Married    Spouse name: Not on file  . Number of children: Not on file  . Years of education: Not on file  . Highest education level: Not on file  Occupational History  . Not on file  Tobacco Use  . Smoking status: Former Games developer  . Smokeless tobacco: Never Used  Substance and Sexual Activity  . Alcohol use: No  . Drug use: Yes    Types: Marijuana    Comment: daily marijuana use  . Sexual activity: Not on file  Other Topics Concern  . Not on file  Social History Narrative  . Not on file   Social Determinants of Health   Financial Resource Strain:   . Difficulty of Paying Living Expenses:   Food Insecurity:   . Worried About Programme researcher, broadcasting/film/video in the Last Year:   . Barista in the Last Year:   Transportation Needs:   . Freight forwarder (Medical):   Marland Kitchen Lack of Transportation (Non-Medical):   Physical Activity:   . Days of Exercise per Week:   . Minutes of Exercise per Session:   Stress:   . Feeling of Stress :   Social Connections:   . Frequency of Communication with Friends and Family:   . Frequency of Social Gatherings with Friends and Family:   . Attends Religious Services:   . Active Member of Clubs or Organizations:   . Attends Banker Meetings:   Marland Kitchen Marital Status:   Intimate Partner Violence:   . Fear of Current or Ex-Partner:   . Emotionally Abused:   Marland Kitchen Physically Abused:   . Sexually Abused:      PHYSICAL EXAM  Vitals:   10/22/19 1125  BP: 115/77  Pulse: 96  Temp: 98.2 F (36.8 C)  Weight: 153 lb (69.4 kg)    Body mass index is 25.46 kg/m.   General: The patient is well-developed and well-nourished and in no acute distress    Neurologic Exam  Mental status: The patient is alert and oriented x 3 at the time of the examination. The patient has apparent  normal recent and remote memory, with an apparently normal attention span and concentration ability.   Speech is normal.  Cranial nerves: Extraocular movements are full. Facial strength and sensation  are normal.  Trapezius and sternocleidomastoid strength is normal. No dysarthria is noted.   Hearing is normal. Motor:  Muscle bulk is normal.   Tone is normal. Strength is  5 / 5 in all 4 extremities except 4+/5 right foot  Sensory: Symmetric touch and vibration sensation.  Coordination: Cerebellar testing reveals good finger-nose-finger and heel-to-shin bilaterally  Gait and station: Station is normal.   It has a mild right foot drop.  Tandem gait is wide.  There is a mild right foot drop.  Her tandem gait.  Romberg is negative.   Reflexes: Deep tendon reflexes are symmetric and normal in arms.   She had increased deep tendon reflexes in the legs and there was spread at the knees.        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   Continue Tecfidera.  Check CBC with Diff.    We will check an MRI of the brain later this year.  If there are additional lesions we would need to consider a different disease modifying therapy (MRI last summer showed one new lesion compared to 2 years earlier). 2.   Continue medications for MS symptoms.   Continue Adderall for ADD/cognitive changes related to MS.  Continue lamotrigine 100 mg po bid for dysesthesias 3.   F/u in 6 months, sooner if problems  40-minute office visit with the majority of the time spent face-to-face for history and physical, discussion/counseling and decision-making.  Additional time with record review and documentation.  Elzora Cullins A. Epimenio Foot, MD, PhD 10/22/2019, 12:42 PM Certified  in Neurology, Clinical Neurophysiology, Sleep Medicine, Pain Medicine and Neuroimaging  [facility] [address], [address] [place], [place] [address] [phone]

## 2019-10-23 ENCOUNTER — Telehealth: Payer: Self-pay | Admitting: *Deleted

## 2019-10-23 LAB — CBC WITH DIFFERENTIAL/PLATELET
Basophils Absolute: 0.1 10*3/uL (ref 0.0–0.2)
Basos: 1 %
EOS (ABSOLUTE): 0.1 10*3/uL (ref 0.0–0.4)
Eos: 1 %
Hematocrit: 40 % (ref 34.0–46.6)
Hemoglobin: 13.3 g/dL (ref 11.1–15.9)
Immature Grans (Abs): 0 10*3/uL (ref 0.0–0.1)
Immature Granulocytes: 0 %
Lymphocytes Absolute: 2.6 10*3/uL (ref 0.7–3.1)
Lymphs: 21 %
MCH: 29.8 pg (ref 26.6–33.0)
MCHC: 33.3 g/dL (ref 31.5–35.7)
MCV: 90 fL (ref 79–97)
Monocytes Absolute: 0.9 10*3/uL (ref 0.1–0.9)
Monocytes: 8 %
Neutrophils Absolute: 8.8 10*3/uL — ABNORMAL HIGH (ref 1.4–7.0)
Neutrophils: 69 %
Platelets: 461 10*3/uL — ABNORMAL HIGH (ref 150–450)
RBC: 4.47 x10E6/uL (ref 3.77–5.28)
RDW: 13 % (ref 11.7–15.4)
WBC: 12.5 10*3/uL — ABNORMAL HIGH (ref 3.4–10.8)

## 2019-10-23 NOTE — Telephone Encounter (Signed)
Called and relayed results to pt per Dr. Epimenio Foot note. She verbalized understanding.   She will call and let me know if she loses her insurance or not.

## 2019-10-23 NOTE — Telephone Encounter (Signed)
-----   Message from Asa Lente, MD sent at 10/23/2019  8:55 AM EDT ----- Please let the patient know that the lab work is fine.

## 2019-11-03 ENCOUNTER — Other Ambulatory Visit: Payer: Self-pay | Admitting: Neurology

## 2019-11-20 ENCOUNTER — Telehealth: Payer: Self-pay | Admitting: *Deleted

## 2019-11-20 ENCOUNTER — Other Ambulatory Visit: Payer: Self-pay | Admitting: Neurology

## 2019-11-20 MED ORDER — AMPHETAMINE-DEXTROAMPHET ER 15 MG PO CP24: 15.0000 mg | ORAL_CAPSULE | ORAL | 0 refills | Status: DC

## 2019-11-20 NOTE — Telephone Encounter (Signed)
1) Medication(s) Requested (by name): amphetamine-dextroamphetamine (ADDERALL XR) 15 MG 24 hr capsule   2) Pharmacy of Choice: [facility] [address] [place], [place] - [address]  [address], [place] [address]  3) Special Requests:   Dimethyl Fumarate (TECFIDERA) 240 MG CPDR  Pt called to inform she has lost her insurance and is needing her tecfidera medication changed

## 2019-11-20 NOTE — Telephone Encounter (Signed)
Called pt. She lost her insurance. She will come Monday to sign [employer] start form so we can get her on free drug via [employer].

## 2019-11-26 NOTE — Telephone Encounter (Addendum)
Faxed completed/signed [employer] start form to [employer] at [phone]. Received fax confirmation.  Since she is uninsured, she is going to try and apply for [place] financial assistance program.

## 2019-11-26 NOTE — Telephone Encounter (Signed)
Called pt since she did not come by Monday to sign. She states she is coming by today. She had to arrange transportation.

## 2019-11-26 NOTE — Addendum Note (Signed)
Addended by: Arther Abbott on: 11/26/2019 03:24 PM   Modules accepted: Orders

## 2019-12-04 NOTE — Telephone Encounter (Signed)
Received updated from [employer] ([employer] rep): "This patient was enrolled in the Free Drug Program on 5/27 & her shipment has been scheduled. She should receive the medication 6/4."

## 2019-12-10 NOTE — Telephone Encounter (Signed)
Called, LVM for pt letting her know the update we received from [employer]. Asked her to call our office back if she did not receive V[employer] or if she has any further questions/concerns.

## 2019-12-18 ENCOUNTER — Other Ambulatory Visit: Payer: Self-pay | Admitting: Neurology

## 2019-12-18 MED ORDER — AMPHETAMINE-DEXTROAMPHET ER 15 MG PO CP24: 15.0000 mg | ORAL_CAPSULE | ORAL | 0 refills | Status: DC

## 2019-12-18 NOTE — Addendum Note (Signed)
Addended by: Ann Maki on: 12/18/2019 02:21 PM   Modules accepted: Orders

## 2019-12-18 NOTE — Addendum Note (Signed)
Addended by: Ann Maki on: 12/18/2019 03:08 PM   Modules accepted: Orders

## 2019-12-18 NOTE — Telephone Encounter (Signed)
Phone rep checked office voicemail's, at 9:45 pt left message asking for a refill on her amphetamine-dextroamphetamine (ADDERALL XR) 15 MG 24 hr capsule to  [facility] [address]

## 2020-01-22 ENCOUNTER — Other Ambulatory Visit: Payer: Self-pay | Admitting: Neurology

## 2020-01-22 MED ORDER — AMPHETAMINE-DEXTROAMPHET ER 15 MG PO CP24: 15.0000 mg | ORAL_CAPSULE | ORAL | 0 refills | Status: DC

## 2020-01-22 NOTE — Telephone Encounter (Signed)
Pt is requesting a refill for amphetamine-dextroamphetamine (ADDERALL XR) 15 MG 24 hr capsule. ? ?Pharmacy: [facility] [address] ? ? ?

## 2020-01-22 NOTE — Addendum Note (Signed)
Addended by: Marcelina Morel L on: 01/22/2020 11:09 AM   Modules accepted: Orders

## 2020-02-25 ENCOUNTER — Other Ambulatory Visit: Payer: Self-pay | Admitting: Neurology

## 2020-02-25 MED ORDER — AMPHETAMINE-DEXTROAMPHET ER 15 MG PO CP24: 15.0000 mg | ORAL_CAPSULE | ORAL | 0 refills | Status: DC

## 2020-02-25 NOTE — Addendum Note (Signed)
Addended by: Arther Abbott on: 02/25/2020 02:39 PM   Modules accepted: Orders

## 2020-02-25 NOTE — Telephone Encounter (Signed)
Pt request refill amphetamine-dextroamphetamine (ADDERALL XR) 15 MG 24 hr capsule at [facility] [address] 

## 2020-03-24 ENCOUNTER — Other Ambulatory Visit: Payer: Self-pay | Admitting: Neurology

## 2020-03-24 ENCOUNTER — Other Ambulatory Visit: Payer: Self-pay | Admitting: *Deleted

## 2020-03-24 MED ORDER — AMPHETAMINE-DEXTROAMPHET ER 15 MG PO CP24: 15.0000 mg | ORAL_CAPSULE | ORAL | 0 refills | Status: DC

## 2020-03-24 MED ORDER — AMITRIPTYLINE HCL 25 MG PO TABS: 25.0000 mg | ORAL_TABLET | Freq: Every day | ORAL | 11 refills | Status: DC

## 2020-03-24 NOTE — Telephone Encounter (Signed)
Spoke with Dr. Epimenio Foot, ok to provide refills for both prescriptions today

## 2020-03-24 NOTE — Telephone Encounter (Signed)
Pt called wanting to know if provider can refill her amphetamine-dextroamphetamine (ADDERALL XR) 15 MG 24 hr capsule and her amitriptyline (ELAVIL) 25 MG tablet ahead of time due her leaving on vacation tomorrow. Pt would like them called in to the [facility] on [address] in [place] Please advise.

## 2020-03-24 NOTE — Telephone Encounter (Signed)
Reviewed pt chart. She was last seen 10/22/19 and next f/u is 04/22/20. Checked drug registry. She last refilled adderall 02/25/20 #30. I do not see where Dr. Epimenio Foot has prescribed amitriptyline for her in the past.   I called the patient to further discuss. She is leaving tomorrow morning for vacation. Needing to pick up prescriptions today if possible. She confirmed Dr. Epimenio Foot has not prescribed amitriptyline for her, it was previously Dr. Andria Meuse (her back surgeon) who prescribed it for sleep/pain.Takes 25mg  po qhs. Wanting Dr. to take over prescribing because Dr. Epimenio Foot told her to request refill from PCP.

## 2020-03-25 MED ORDER — AMPHETAMINE-DEXTROAMPHET ER 15 MG PO CP24: 15.0000 mg | ORAL_CAPSULE | ORAL | 0 refills | Status: DC

## 2020-03-25 NOTE — Telephone Encounter (Signed)
Adderall XR prescription re-sent.

## 2020-03-25 NOTE — Addendum Note (Signed)
Addended by: Arther Abbott on: 03/25/2020 07:09 AM   Modules accepted: Orders

## 2020-03-25 NOTE — Addendum Note (Signed)
Addended by: Huston Foley on: 03/25/2020 07:17 AM   Modules accepted: Orders

## 2020-04-22 ENCOUNTER — Ambulatory Visit: Payer: 59 | Admitting: Neurology

## 2020-04-22 ENCOUNTER — Encounter: Payer: Self-pay | Admitting: Neurology

## 2020-04-27 ENCOUNTER — Encounter: Payer: Self-pay | Admitting: Neurology

## 2020-04-27 ENCOUNTER — Ambulatory Visit: Payer: Self-pay | Admitting: Neurology

## 2020-04-27 VITALS — BP 105/70 | HR 68 | Ht 65.0 in | Wt 148.0 lb

## 2020-04-27 DIAGNOSIS — G35 Multiple sclerosis: Secondary | ICD-10-CM

## 2020-04-27 DIAGNOSIS — Z79899 Other long term (current) drug therapy: Secondary | ICD-10-CM

## 2020-04-27 DIAGNOSIS — R269 Unspecified abnormalities of gait and mobility: Secondary | ICD-10-CM

## 2020-04-27 DIAGNOSIS — R208 Other disturbances of skin sensation: Secondary | ICD-10-CM

## 2020-04-27 DIAGNOSIS — R4189 Other symptoms and signs involving cognitive functions and awareness: Secondary | ICD-10-CM

## 2020-04-27 DIAGNOSIS — F418 Other specified anxiety disorders: Secondary | ICD-10-CM

## 2020-04-27 DIAGNOSIS — R5383 Other fatigue: Secondary | ICD-10-CM

## 2020-04-27 NOTE — Progress Notes (Signed)
[facility]  PATIENT: Diane Carson DOB: 1966-03-14  REFERRING DOCTOR OR PCP:  Referred by Dr. Stacy Gardner. Her PCP is Guerry Bruin. SOURCE: Patient, notes from Dr. Anne Hahn, MRI and lab reports, MRI images on PACS.  _________________________________   HISTORICAL  CHIEF COMPLAINT:  Chief Complaint  Patient presents with  . Follow-up    RM 12, alone. Last seen 10/22/19. Still does not have insurance. Doing well, denies any new sx.  . Multiple Sclerosis    On Vumerity     HISTORY OF PRESENT ILLNESS:   Diane Carson is a 54 y.o. 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  Update 04/27/2020: Her MS was doing well on generic Tecfidera and she switched to Vumerity due to better free drug coverage.   No new neurologic symptoms or exacerbations.     Gait is mildly off balanced but stable and she has no falls.   Strength is fine.  She get spasms, helped by tizanidine.  She has dysesthetic pain in the limbs and also has sciatic type pain on her left..    She has some benefit from lamotrigine 100 mg daily and gabapentin 300 mg - 600 mg and amitriptyline.    Her vision is slightly blurry but stable.  She feels it is a bit worse when more tired.     Bladder is fine.   She has decreased focus/attention and fatigue.    Adderall has helped cognition and she tolerates it well.   Mood is doing well on escitalopram 45 mg and lamotrigine.   She has sleep maintenance insomnia.   Trazodone was switched to amitriptyline and it helps insomnia as much.   No dry mouth or other s.e.         MRI of the brain July 2020 in [facility] showed one small new right frontal white matter focus compared to MRI 2018.   MRI cervical spine showed a very small dot in the spinal cord at C6C7 (left posterolateral), not clearly present in 2017 but also seen on the 2013 MRi --- artifact vs stable tiny focus    MS History:    In 1999, she had left optic neuritis. She did not receive steroids at that time but did have an  MRI of the brain that was reportedly normal. She did very well for the next 14 years. In 2013, she woke up with numbness in her feet the one day. Over the next 2 days numbness evolved to be more intense and be in the distribution up to the mid thoracic level.   She also had decreased balance and fell a couple times. She noted that the numbness was near total of the breast. She received several days of IV steroids and symptoms improved, but not completely to baseline.    Between 2013 and 2017,  she has had no definite exacerbation. However, she has noted more difficulty with cognitive function, emotional lability, and fatigue.    MRI of the brain and spine dated 06/07/2016 and compared with the MRI dated 08/28/2011.  The older MRI shows a couple small T2/FLAIR hyperintense foci in the deep white matter and white matter. On the current study, there are about 6 or 7 more foci in the hemispheres including more in the periventricular white matter and 2 foci in the right cerebellar hemisphere. The 2017 MRI of the cervical spine shows mild degenerative changes (with foraminal narrowing to the left at C7T1) 5but a normal spinal cord. The 2017 MRI of the thoracic spine shows  a chronic plaque at T7.  Also, there is a disc protrusion at T9-T10.   The NMO Ab is negative.     IMAGING MRI of the brain July 2020 in [facility] showed one small new right frontal white matter focus compared to MRI 2018.   MRI cervical spine showed a very small dot in the spinal cord at C6C7 (left posterolateral), not clearly present in 2017 but also seen on the 2013 MRi --- artifact vs stable tiny focus  REVIEW OF SYSTEMS: Constitutional: No fevers, chills, sweats, or change in appetite.   She has Fatigue Eyes: Notes occ blurred vision.  N0 double vision, eye pain Ear, nose and throat: No hearing loss, ear pain, nasal congestion, sore throat Cardiovascular: No chest pain, palpitations Respiratory: No shortness of breath at rest or with  exertion.   No wheezes GastrointestinaI: No nausea, vomiting, diarrhea, abdominal pain, fecal incontinence Genitourinary: No dysuria, urinary retention or frequency.  No nocturia. Musculoskeletal: No neck pain, back pain Integumentary: No rash, pruritus, skin lesions Neurological: as above Psychiatric: Notes some depression and anxiety.  She is going through a divorce,  Endocrine: No palpitations, diaphoresis, change in appetite, change in weigh or increased thirst Hematologic/Lymphatic: No anemia, purpura, petechiae. Allergic/Immunologic: No itchy/runny eyes, nasal congestion, recent allergic reactions, rashes  ALLERGIES: Allergies  Allergen Reactions  . Atorvastatin     Ringing of the ears, itching, heartburn  . Codeine Shortness Of Breath    HOME MEDICATIONS:  Current Outpatient Medications:  .  alendronate (FOSAMAX) 70 MG tablet, Take 70 mg by mouth once a week. , Disp: , Rfl:  .  amitriptyline (ELAVIL) 25 MG tablet, Take 1 tablet (25 mg total) by mouth at bedtime., Disp: 30 tablet, Rfl: 11 .  amphetamine-dextroamphetamine (ADDERALL XR) 15 MG 24 hr capsule, Take 1 capsule by mouth every morning., Disp: 30 capsule, Rfl: 0 .  calcium carbonate (OSCAL) 1500 (600 Ca) MG TABS tablet, Take 600 mg of elemental calcium by mouth 2 (two) times daily with a meal., Disp: , Rfl:  .  celecoxib (CELEBREX) 200 MG capsule, Take 200 mg by mouth 2 (two) times daily., Disp: , Rfl:  .  cholecalciferol (VITAMIN D) 1000 units tablet, Take 5,000 Units by mouth daily., Disp: , Rfl:  .  Diroximel Fumarate (VUMERITY) 231 MG CPDR, Take by mouth., Disp: , Rfl:  .  escitalopram (LEXAPRO) 20 MG tablet, TAKE 1.5 TABLETS(30 MG) BY MOUTH DAILY, Disp: 45 tablet, Rfl: 11 .  gabapentin (NEURONTIN) 300 MG capsule, Take one pill po qAM and 2 pills po qHS, Disp: 270 capsule, Rfl: 4 .  lamoTRIgine (LAMICTAL) 100 MG tablet, Take 1 tablet (100 mg total) by mouth 2 (two) times daily., Disp: 60 tablet, Rfl: 11 .  Multiple  Vitamin (MULTIVITAMIN) tablet, Take 1 tablet by mouth daily., Disp: , Rfl:  .  tiZANidine (ZANAFLEX) 4 MG tablet, TAKE 2 TABLETS BY MOUTH THREE TIMES A DAY, Disp: 180 tablet, Rfl: 5 No current facility-administered medications for this visit.  Facility-Administered Medications Ordered in Other Visits:  .  gadopentetate dimeglumine (MAGNEVIST) injection 13 mL, 13 mL, Intravenous, Once PRN, Evamaria Detore, Pearletha Furl, MD  PAST MEDICAL HISTORY: Past Medical History:  Diagnosis Date  . Degenerative disc disease, cervical   . Multiple sclerosis (HCC)   . Osteoarthritis    thumbs  . Osteoporosis   . Vision abnormalities     PAST SURGICAL HISTORY: Past Surgical History:  Procedure Laterality Date  . BREAST BIOPSY Right    Biopsy  over 20 years ago, done in [place]  . JOINT REPLACEMENT Right 09/07/2016   thumb    FAMILY HISTORY: Family History  Adopted: Yes  Problem Relation Age of Onset  . Colon cancer Brother     SOCIAL HISTORY:  Social History   Socioeconomic History  . Marital status: Married    Spouse name: Not on file  . Number of children: Not on file  . Years of education: Not on file  . Highest education level: Not on file  Occupational History  . Not on file  Tobacco Use  . Smoking status: Former Games developer  . Smokeless tobacco: Never Used  Substance and Sexual Activity  . Alcohol use: No  . Drug use: Yes    Types: Marijuana    Comment: daily marijuana use  . Sexual activity: Not on file  Other Topics Concern  . Not on file  Social History Narrative  . Not on file   Social Determinants of Health   Financial Resource Strain:   . Difficulty of Paying Living Expenses: Not on file  Food Insecurity:   . Worried About Programme researcher, broadcasting/film/video in the Last Year: Not on file  . Ran Out of Food in the Last Year: Not on file  Transportation Needs:   . Lack of Transportation (Medical): Not on file  . Lack of Transportation (Non-Medical): Not on file  Physical Activity:   .  Days of Exercise per Week: Not on file  . Minutes of Exercise per Session: Not on file  Stress:   . Feeling of Stress : Not on file  Social Connections:   . Frequency of Communication with Friends and Family: Not on file  . Frequency of Social Gatherings with Friends and Family: Not on file  . Attends Religious Services: Not on file  . Active Member of Clubs or Organizations: Not on file  . Attends Banker Meetings: Not on file  . Marital Status: Not on file  Intimate Partner Violence:   . Fear of Current or Ex-Partner: Not on file  . Emotionally Abused: Not on file  . Physically Abused: Not on file  . Sexually Abused: Not on file     PHYSICAL EXAM  Vitals:   04/27/20 1258  BP: 105/70  Pulse: 68  SpO2: 96%  Weight: 148 lb (67.1 kg)  Height: 5\' 5"  (1.651 m)    Body mass index is 24.63 kg/m.   General: The patient is well-developed and well-nourished and in no acute distress    Neurologic Exam  Mental status: The patient is alert and oriented x 3 at the time of the examination. The patient has apparent normal recent and remote memory, with an apparently normal attention span and concentration ability.   Speech is normal.  Cranial nerves: Extraocular movements are full. Facial strength and sensation are normal.  Trapezius and sternocleidomastoid strength is normal. No dysarthria is noted.   Hearing is normal. Motor:  Muscle bulk is normal.   Tone is normal. Strength is  5 / 5 in all 4 extremities except 4+/5 right foot  Sensory: Symmetric touch and vibration sensation.  Coordination: Cerebellar testing reveals good finger-nose-finger and heel-to-shin bilaterally  Gait and station: Station is normal.   It has a mild right foot drop.  Tandem gait is wide.    Her tandem gait is poor .  Romberg is negative.   Reflexes: Deep tendon reflexes are symmetric and normal in arms.   She had increased deep  tendon reflexes in the legs and there was spread at the knees.         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   Continue Vumerity.  Check CBC with Diff.    We will check an MRI of the brain around time of next visit  If there are additional lesions we would need to consider a different disease modifying therapy (MRI in 2020 showed one new lesion compared to 2 years earlier). 2.   Continue medications for MS symptoms.   Continue Adderall for ADD/cognitive changes related to MS.  Continue lamotrigine 100 mg po bid for dysesthesias 3.   F/u in 6 months, sooner if problems   Donnae Michels A. Epimenio Foot, MD, PhD 04/27/2020, 1:33 PM Certified in Neurology, Clinical Neurophysiology, Sleep Medicine, Pain Medicine and Neuroimaging  [facility] [address], [address] [place], [place] [address] [phone]

## 2020-04-28 ENCOUNTER — Telehealth: Payer: Self-pay | Admitting: *Deleted

## 2020-04-28 LAB — CBC WITH DIFFERENTIAL/PLATELET
Basophils Absolute: 0 10*3/uL (ref 0.0–0.2)
Basos: 1 %
EOS (ABSOLUTE): 0.1 10*3/uL (ref 0.0–0.4)
Eos: 2 %
Hematocrit: 37.9 % (ref 34.0–46.6)
Hemoglobin: 12.6 g/dL (ref 11.1–15.9)
Immature Grans (Abs): 0 10*3/uL (ref 0.0–0.1)
Immature Granulocytes: 0 %
Lymphocytes Absolute: 2.5 10*3/uL (ref 0.7–3.1)
Lymphs: 33 %
MCH: 29.9 pg (ref 26.6–33.0)
MCHC: 33.2 g/dL (ref 31.5–35.7)
MCV: 90 fL (ref 79–97)
Monocytes Absolute: 0.7 10*3/uL (ref 0.1–0.9)
Monocytes: 9 %
Neutrophils Absolute: 4.2 10*3/uL (ref 1.4–7.0)
Neutrophils: 55 %
Platelets: 333 10*3/uL (ref 150–450)
RBC: 4.21 x10E6/uL (ref 3.77–5.28)
RDW: 12.7 % (ref 11.7–15.4)
WBC: 7.5 10*3/uL (ref 3.4–10.8)

## 2020-04-28 NOTE — Telephone Encounter (Signed)
-----   Message from Asa Lente, MD sent at 04/28/2020 10:10 AM EDT ----- Please let her know that the blood counts are normal.

## 2020-04-28 NOTE — Telephone Encounter (Signed)
Called and spoke with pt about lab results per Dr. Sater note. Pt verbalized understanding.  

## 2020-05-04 ENCOUNTER — Other Ambulatory Visit: Payer: Self-pay | Admitting: Neurology

## 2020-05-12 ENCOUNTER — Other Ambulatory Visit: Payer: Self-pay | Admitting: Neurology

## 2020-05-12 MED ORDER — CELECOXIB 200 MG PO CAPS: 200.0000 mg | ORAL_CAPSULE | Freq: Two times a day (BID) | ORAL | 0 refills | Status: DC

## 2020-05-12 MED ORDER — AMPHETAMINE-DEXTROAMPHET ER 15 MG PO CP24: 15.0000 mg | ORAL_CAPSULE | ORAL | 0 refills | Status: DC

## 2020-05-12 MED ORDER — GABAPENTIN 300 MG PO CAPS: 900.0000 mg | ORAL_CAPSULE | Freq: Two times a day (BID) | ORAL | 0 refills | Status: DC

## 2020-05-12 NOTE — Telephone Encounter (Signed)
I returned the call to the patient. She was just seen in the office on 04/27/20. States Dr. Epimenio Foot agreed to manage refills on the following medications:  1) celecoxib 200mg , one capsule BID 2) gabapentin 300mg , three capsules BID  She also requested her Adderall XR 15mg , one capsule daily be filled.  [place] narcotic registry checked. Last refill for #30 on 03/25/2020.

## 2020-05-12 NOTE — Telephone Encounter (Signed)
Pt states she was told Dr Epimenio Foot would manage her celecoxib (CELEBREX) 200 MG capsule & gabapentin (NEURONTIN) 300 MG capsule.  Pt is asking for a refill to [facility] [address]

## 2020-05-12 NOTE — Addendum Note (Signed)
Addended by: Lilla Shook on: 05/12/2020 09:46 AM   Modules accepted: Orders

## 2020-06-02 ENCOUNTER — Other Ambulatory Visit: Payer: Self-pay | Admitting: Neurology

## 2020-06-29 ENCOUNTER — Other Ambulatory Visit: Payer: Self-pay | Admitting: Neurology

## 2020-06-29 MED ORDER — AMPHETAMINE-DEXTROAMPHET ER 15 MG PO CP24
15.0000 mg | ORAL_CAPSULE | ORAL | 0 refills | Status: DC
Start: 2020-06-29 — End: 2020-07-26

## 2020-06-29 NOTE — Telephone Encounter (Signed)
Pt called needing a refill on her amphetamine-dextroamphetamine (ADDERALL XR) 15 MG 24 hr capsule sent in to the [facility] on [address]

## 2020-07-08 DIAGNOSIS — M1812 Unilateral primary osteoarthritis of first carpometacarpal joint, left hand: Secondary | ICD-10-CM | POA: Insufficient documentation

## 2020-07-26 ENCOUNTER — Other Ambulatory Visit: Payer: Self-pay | Admitting: Neurology

## 2020-07-26 MED ORDER — AMPHETAMINE-DEXTROAMPHET ER 15 MG PO CP24
15.0000 mg | ORAL_CAPSULE | ORAL | 0 refills | Status: DC
Start: 2020-07-26 — End: 2020-08-30

## 2020-07-26 NOTE — Telephone Encounter (Signed)
Pt. is requesting a refill for amphetamine-dextroamphetamine (ADDERALL XR) 15 MG 24 hr capsule.  Pharmacy: [facility] [address]

## 2020-07-26 NOTE — Addendum Note (Signed)
Addended by: Arther Abbott on: 07/26/2020 03:22 PM   Modules accepted: Orders

## 2020-07-29 ENCOUNTER — Ambulatory Visit: Payer: 59 | Admitting: Neurology

## 2020-08-09 ENCOUNTER — Other Ambulatory Visit: Payer: Self-pay | Admitting: Neurology

## 2020-08-30 ENCOUNTER — Other Ambulatory Visit: Payer: Self-pay | Admitting: Neurology

## 2020-08-30 MED ORDER — AMPHETAMINE-DEXTROAMPHET ER 15 MG PO CP24
15.0000 mg | ORAL_CAPSULE | ORAL | 0 refills | Status: DC
Start: 2020-08-30 — End: 2020-09-29

## 2020-08-30 NOTE — Telephone Encounter (Signed)
Pt request refill amphetamine-dextroamphetamine (ADDERALL XR) 15 MG 24 hr capsule at [facility] [address]

## 2020-09-29 ENCOUNTER — Other Ambulatory Visit: Payer: Self-pay | Admitting: Neurology

## 2020-09-29 MED ORDER — AMPHETAMINE-DEXTROAMPHET ER 15 MG PO CP24
15.0000 mg | ORAL_CAPSULE | ORAL | 0 refills | Status: DC
Start: 2020-09-29 — End: 2020-11-01

## 2020-09-29 NOTE — Addendum Note (Signed)
Addended by: Arther Abbott on: 09/29/2020 02:41 PM   Modules accepted: Orders

## 2020-09-29 NOTE — Telephone Encounter (Signed)
Pt is requesting a refill for amphetamine-dextroamphetamine (ADDERALL XR) 15 MG 24 hr capsule.  Pharmacy: [facility] [address]

## 2020-10-26 ENCOUNTER — Ambulatory Visit: Payer: 59 | Admitting: Family Medicine

## 2020-11-01 ENCOUNTER — Other Ambulatory Visit: Payer: Self-pay | Admitting: Neurology

## 2020-11-01 MED ORDER — CELECOXIB 200 MG PO CAPS: 200.0000 mg | ORAL_CAPSULE | Freq: Two times a day (BID) | ORAL | 0 refills | Status: DC

## 2020-11-01 MED ORDER — GABAPENTIN 300 MG PO CAPS: ORAL_CAPSULE | ORAL | 0 refills | Status: DC

## 2020-11-01 MED ORDER — AMPHETAMINE-DEXTROAMPHET ER 15 MG PO CP24: 15.0000 mg | ORAL_CAPSULE | ORAL | 0 refills | Status: DC

## 2020-11-01 NOTE — Telephone Encounter (Signed)
Pt request refill gabapentin (NEURONTIN) 300 MG capsule and celecoxib (CELEBREX) 200 MG capsule and amphetamine-dextroamphetamine (ADDERALL XR) 15 MG 24 hr capsule at [facility] 341

## 2020-11-01 NOTE — Telephone Encounter (Signed)
E-scribed refill for gabapentin and celebrex. Sent adderall request to MD to e-scribe.

## 2020-12-05 ENCOUNTER — Other Ambulatory Visit: Payer: Self-pay | Admitting: Neurology

## 2020-12-07 ENCOUNTER — Other Ambulatory Visit: Payer: Self-pay | Admitting: Neurology

## 2020-12-07 MED ORDER — AMPHETAMINE-DEXTROAMPHET ER 15 MG PO CP24: 15.0000 mg | ORAL_CAPSULE | ORAL | 0 refills | Status: DC

## 2020-12-07 NOTE — Addendum Note (Signed)
Addended by: Arther Abbott on: 12/07/2020 10:55 AM   Modules accepted: Orders

## 2020-12-07 NOTE — Telephone Encounter (Signed)
Pt is requesting a refill for mphetamine-dextroamphetamine (ADDERALL XR) 15 MG 24 hr capsule.  Pharmacy: [facility] [address]

## 2021-01-13 ENCOUNTER — Ambulatory Visit: Payer: 59 | Admitting: Family Medicine

## 2021-01-13 ENCOUNTER — Other Ambulatory Visit: Payer: Self-pay | Admitting: Neurology

## 2021-01-13 ENCOUNTER — Telehealth: Payer: Self-pay | Admitting: Neurology

## 2021-01-13 MED ORDER — AMPHETAMINE-DEXTROAMPHET ER 15 MG PO CP24: 15.0000 mg | ORAL_CAPSULE | ORAL | 0 refills | Status: DC

## 2021-01-13 NOTE — Telephone Encounter (Signed)
Pt is requesting a refill for amphetamine-dextroamphetamine (ADDERALL XR) 15 MG 24 hr capsule .  Pharmacy: [facility]

## 2021-01-13 NOTE — Telephone Encounter (Signed)
I have routed this request to Dr Sater for review. The pt is due for the medication and [place] registry was verified.  

## 2021-01-29 ENCOUNTER — Other Ambulatory Visit: Payer: Self-pay | Admitting: Neurology

## 2021-02-11 ENCOUNTER — Other Ambulatory Visit: Payer: Self-pay | Admitting: Neurology

## 2021-02-21 ENCOUNTER — Other Ambulatory Visit: Payer: Self-pay | Admitting: Neurology

## 2021-02-21 MED ORDER — AMPHETAMINE-DEXTROAMPHET ER 15 MG PO CP24: 15.0000 mg | ORAL_CAPSULE | ORAL | 0 refills | Status: DC

## 2021-02-21 NOTE — Telephone Encounter (Signed)
Pt has called for a refill on her amphetamine-dextroamphetamine (ADDERALL XR) 15 MG 24 hr capsule to  [facility] [address]

## 2021-02-28 NOTE — Progress Notes (Deleted)
No chief complaint on file.    HISTORY OF PRESENT ILLNESS: 02/28/21 ALL:  Diane Carson is a 55 y.o. female here today for follow up for RRMS. She continues Vumerity. Labs have been stable. Last MRI 2020 showed one lew lesion, right frontal, compared to 2018. MRI cervical spine showed very small focus in spinal cord at C6-C7, artifact vs new focus.   Lamotrigine 100mg  BID, amitriptyline 25mg  QHS and gabapentin 300mg  BID helps with dysesthesias and insomnia.   Tizanidine 8mg  TID PRN celecoxib200mg  BID  Adderall   Escitalopram 30mg  QD   HISTORY (copied from Dr previous note)  Diane Carson is a 55 y.o. 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   Update 04/27/2020: Her MS was doing well on generic Tecfidera and she switched to Vumerity due to better free drug coverage.   No new neurologic symptoms or exacerbations.      Gait is mildly off balanced but stable and she has no falls.   Strength is fine.  She get spasms, helped by tizanidine.  She has dysesthetic pain in the limbs and also has sciatic type pain on her left..    She has some benefit from lamotrigine 100 mg daily and gabapentin 300 mg - 600 mg and amitriptyline.    Her vision is slightly blurry but stable.  She feels it is a bit worse when more tired.     Bladder is fine.    She has decreased focus/attention and fatigue.    Adderall has helped cognition and she tolerates it well.   Mood is doing well on escitalopram 45 mg and lamotrigine.   She has sleep maintenance insomnia.   Trazodone was switched to amitriptyline and it helps insomnia as much.   No dry mouth or other s.e.         MRI of the brain July 2020 in [facility] showed one small new right frontal white matter focus compared to MRI 2018.   MRI cervical spine showed a very small dot in the spinal cord at C6C7 (left posterolateral), not clearly present in 2017 but also seen on the 2013 MRi --- artifact vs stable tiny focus     MS History:    In 1999, she had left optic  neuritis. She did not receive steroids at that time but did have an MRI of the brain that was reportedly normal. She did very well for the next 14 years. In 2013, she woke up with numbness in her feet the one day. Over the next 2 days numbness evolved to be more intense and be in the distribution up to the mid thoracic level.   She also had decreased balance and fell a couple times. She noted that the numbness was near total of the breast. She received several days of IV steroids and symptoms improved, but not completely to baseline.    Between 2013 and 2017,  she has had no definite exacerbation. However, she has noted more difficulty with cognitive function, emotional lability, and fatigue.    MRI of the brain and spine dated 06/07/2016 and compared with the MRI dated 08/28/2011.  The older MRI shows a couple small T2/FLAIR hyperintense foci in the deep white matter and white matter. On the current study, there are about 6 or 7 more foci in the hemispheres including more in the periventricular white matter and 2 foci in the right cerebellar hemisphere. The 2017 MRI of the cervical spine shows mild degenerative changes (with foraminal narrowing to the left  at C7T1) 5but a normal spinal cord. The 2017 MRI of the thoracic spine shows a chronic plaque at T7.  Also, there is a disc protrusion at T9-T10.   The NMO Ab is negative.      IMAGING MRI of the brain July 2020 in [facility] showed one small new right frontal white matter focus compared to MRI 2018.   MRI cervical spine showed a very small dot in the spinal cord at C6C7 (left posterolateral), not clearly present in 2017 but also seen on the 2013 MRi --- artifact vs stable tiny focus   REVIEW OF SYSTEMS: Out of a complete 14 system review of symptoms, the patient complains only of the following symptoms, and all other reviewed systems are negative.   ALLERGIES: Allergies  Allergen Reactions   Atorvastatin     Ringing of the ears, itching, heartburn    Codeine Shortness Of Breath     HOME MEDICATIONS: Outpatient Medications Prior to Visit  Medication Sig Dispense Refill   alendronate (FOSAMAX) 70 MG tablet Take 70 mg by mouth once a week.      amitriptyline (ELAVIL) 25 MG tablet Take 1 tablet (25 mg total) by mouth at bedtime. 30 tablet 11   amphetamine-dextroamphetamine (ADDERALL XR) 15 MG 24 hr capsule Take 1 capsule by mouth every morning. 30 capsule 0   calcium carbonate (OSCAL) 1500 (600 Ca) MG TABS tablet Take 600 mg of elemental calcium by mouth 2 (two) times daily with a meal.     celecoxib (CELEBREX) 200 MG capsule TAKE 1 CAPSULE BY MOUTH 2 TIMES DAILY 180 capsule 0   cholecalciferol (VITAMIN D) 1000 units tablet Take 5,000 Units by mouth daily.     Diroximel Fumarate (VUMERITY) 231 MG CPDR Take by mouth.     escitalopram (LEXAPRO) 20 MG tablet TAKE 1 AND 1/2 TABLETS BY MOUTH DAILY 45 tablet 11   gabapentin (NEURONTIN) 300 MG capsule TAKE 3 CAPSULES BY MOUTH 2 TIMES A DAY 540 capsule 0   lamoTRIgine (LAMICTAL) 100 MG tablet TAKE 1 TABLET BY MOUTH 2 TIMES DAILY 60 tablet 11   Multiple Vitamin (MULTIVITAMIN) tablet Take 1 tablet by mouth daily.     tiZANidine (ZANAFLEX) 4 MG tablet Take 2 tablets (8 mg total) by mouth 3 (three) times daily. Please keep upcoming appt for further refills 180 tablet 0   Facility-Administered Medications Prior to Visit  Medication Dose Route Frequency Provider Last Rate Last Admin   gadopentetate dimeglumine (MAGNEVIST) injection 13 mL  13 mL Intravenous Once PRN Sater, Pearletha Furl, MD         PAST MEDICAL HISTORY: Past Medical History:  Diagnosis Date   Degenerative disc disease, cervical    Multiple sclerosis (HCC)    Osteoarthritis    thumbs   Osteoporosis    Vision abnormalities      PAST SURGICAL HISTORY: Past Surgical History:  Procedure Laterality Date   BREAST BIOPSY Right    Biopsy over 20 years ago, done in [place]   JOINT REPLACEMENT Right 09/07/2016   thumb      FAMILY HISTORY: Family History  Adopted: Yes  Problem Relation Age of Onset   Colon cancer Brother      SOCIAL HISTORY: Social History   Socioeconomic History   Marital status: Married    Spouse name: Not on file   Number of children: Not on file   Years of education: Not on file   Highest education level: Not on file  Occupational History  Not on file  Tobacco Use   Smoking status: Former   Smokeless tobacco: Never  Substance and Sexual Activity   Alcohol use: No   Drug use: Yes    Types: Marijuana    Comment: daily marijuana use   Sexual activity: Not on file  Other Topics Concern   Not on file  Social History Narrative   Not on file   Social Determinants of Health   Financial Resource Strain: Not on file  Food Insecurity: Not on file  Transportation Needs: Not on file  Physical Activity: Not on file  Stress: Not on file  Social Connections: Not on file  Intimate Partner Violence: Not on file     PHYSICAL EXAM  There were no vitals filed for this visit. There is no height or weight on file to calculate BMI.   Generalized: Well developed, in no acute distress  Cardiology: normal rate and rhythm, no murmur auscultated  Respiratory: clear to auscultation bilaterally    Neurological examination  Mentation: Alert oriented to time, place, history taking. Follows all commands speech and language fluent Cranial nerve II-XII: Pupils were equal round reactive to light. Extraocular movements were full, visual field were full on confrontational test. Facial sensation and strength were normal. Uvula tongue midline. Head turning and shoulder shrug  were normal and symmetric. Motor: The motor testing reveals 5 over 5 strength of all 4 extremities. Good symmetric motor tone is noted throughout.  Sensory: Sensory testing is intact to soft touch on all 4 extremities. No evidence of extinction is noted.  Coordination: Cerebellar testing reveals good  finger-nose-finger and heel-to-shin bilaterally.  Gait and station: Gait is normal. Tandem gait is normal. Romberg is negative. No drift is seen.  Reflexes: Deep tendon reflexes are symmetric and normal bilaterally.    DIAGNOSTIC DATA (LABS, IMAGING, TESTING) - I reviewed patient records, labs, notes, testing and imaging myself where available.  Lab Results  Component Value Date   WBC 7.5 04/27/2020   HGB 12.6 04/27/2020   HCT 37.9 04/27/2020   MCV 90 04/27/2020   PLT 333 04/27/2020      Component Value Date/Time   NA 139 04/20/2017 1018   K 4.7 04/20/2017 1018   CL 97 04/20/2017 1018   CO2 27 04/20/2017 1018   GLUCOSE 78 04/20/2017 1018   BUN 12 04/20/2017 1018   CREATININE 0.78 04/20/2017 1018   CALCIUM 10.2 04/20/2017 1018   PROT 6.9 04/20/2017 1018   ALBUMIN 4.6 04/20/2017 1018   AST 24 04/20/2017 1018   ALT 21 04/20/2017 1018   ALKPHOS 64 04/20/2017 1018   BILITOT 0.2 04/20/2017 1018   GFRNONAA 90 04/20/2017 1018   GFRAA 103 04/20/2017 1018   No results found for: CHOL, HDL, LDLCALC, LDLDIRECT, TRIG, CHOLHDL No results found for: YBOF7P No results found for: VITAMINB12 No results found for: TSH  No flowsheet data found.   No flowsheet data found.   ASSESSMENT AND PLAN  55 y.o. year old female  has a past medical history of Degenerative disc disease, cervical, Multiple sclerosis (HCC), Osteoarthritis, Osteoporosis, and Vision abnormalities. here with    No diagnosis found.   No orders of the defined types were placed in this encounter.    No orders of the defined types were placed in this encounter.     Shawnie Dapper, MSN, FNP-C 02/28/2021, 8:20 AM  [facility] [address], [address] [place], [place] [address] [phone]

## 2021-03-01 ENCOUNTER — Telehealth: Payer: Self-pay | Admitting: Neurology

## 2021-03-01 ENCOUNTER — Ambulatory Visit: Payer: 59 | Admitting: Family Medicine

## 2021-03-01 DIAGNOSIS — G35 Multiple sclerosis: Secondary | ICD-10-CM

## 2021-03-01 DIAGNOSIS — R5383 Other fatigue: Secondary | ICD-10-CM

## 2021-03-01 DIAGNOSIS — R208 Other disturbances of skin sensation: Secondary | ICD-10-CM

## 2021-03-01 DIAGNOSIS — F418 Other specified anxiety disorders: Secondary | ICD-10-CM

## 2021-03-01 DIAGNOSIS — G4700 Insomnia, unspecified: Secondary | ICD-10-CM

## 2021-03-01 DIAGNOSIS — R4189 Other symptoms and signs involving cognitive functions and awareness: Secondary | ICD-10-CM

## 2021-03-01 DIAGNOSIS — R269 Unspecified abnormalities of gait and mobility: Secondary | ICD-10-CM

## 2021-03-01 NOTE — Telephone Encounter (Signed)
Pt cancelled appt due to not able to get off work.

## 2021-03-01 NOTE — Telephone Encounter (Signed)
noted 

## 2021-03-17 DIAGNOSIS — Z0289 Encounter for other administrative examinations: Secondary | ICD-10-CM

## 2021-03-21 ENCOUNTER — Other Ambulatory Visit: Payer: Self-pay | Admitting: Neurology

## 2021-03-23 ENCOUNTER — Other Ambulatory Visit: Payer: Self-pay | Admitting: Neurology

## 2021-03-23 MED ORDER — AMPHETAMINE-DEXTROAMPHET ER 15 MG PO CP24: 15.0000 mg | ORAL_CAPSULE | ORAL | 0 refills | Status: DC

## 2021-03-23 NOTE — Telephone Encounter (Signed)
Pt called requesting refill for amphetamine-dextroamphetamine (ADDERALL XR) 15 MG 24 hr capsule. Pharmacy [facility] [id number].

## 2021-03-29 ENCOUNTER — Other Ambulatory Visit: Payer: Self-pay | Admitting: Neurology

## 2021-04-26 ENCOUNTER — Other Ambulatory Visit: Payer: Self-pay | Admitting: Neurology

## 2021-04-28 ENCOUNTER — Other Ambulatory Visit: Payer: Self-pay | Admitting: Neurology

## 2021-04-28 MED ORDER — AMPHETAMINE-DEXTROAMPHET ER 15 MG PO CP24: 15.0000 mg | ORAL_CAPSULE | ORAL | 0 refills | Status: DC

## 2021-04-28 NOTE — Telephone Encounter (Signed)
amphetamine-dextroamphetamine (ADDERALL XR) 15 MG 24 hr capsule to  [facility] 341

## 2021-04-28 NOTE — Telephone Encounter (Signed)
Because of the medication class patient must be seen sooner than the scheduled appointment on 5.  Called the patient and was able to work her in on Monday for a 10 AM opening with Amy.  Patient advised to check in at 9:30 AM.  Advised a prescription will be sent to the pharmacy for her and that she must keep the upcoming appointment on Monday for future refills.

## 2021-04-28 NOTE — Addendum Note (Signed)
Addended by: Judi Cong on: 04/28/2021 10:57 AM   Modules accepted: Orders

## 2021-04-29 ENCOUNTER — Other Ambulatory Visit: Payer: Self-pay | Admitting: Neurology

## 2021-05-02 ENCOUNTER — Encounter: Payer: Self-pay | Admitting: Family Medicine

## 2021-05-02 ENCOUNTER — Ambulatory Visit: Payer: Self-pay | Admitting: Family Medicine

## 2021-05-02 VITALS — BP 114/77 | HR 71 | Ht 65.0 in | Wt 155.0 lb

## 2021-05-02 DIAGNOSIS — R4189 Other symptoms and signs involving cognitive functions and awareness: Secondary | ICD-10-CM

## 2021-05-02 DIAGNOSIS — G4700 Insomnia, unspecified: Secondary | ICD-10-CM

## 2021-05-02 DIAGNOSIS — F418 Other specified anxiety disorders: Secondary | ICD-10-CM

## 2021-05-02 DIAGNOSIS — Z79899 Other long term (current) drug therapy: Secondary | ICD-10-CM

## 2021-05-02 DIAGNOSIS — R5383 Other fatigue: Secondary | ICD-10-CM

## 2021-05-02 DIAGNOSIS — R208 Other disturbances of skin sensation: Secondary | ICD-10-CM

## 2021-05-02 DIAGNOSIS — G35 Multiple sclerosis: Secondary | ICD-10-CM

## 2021-05-02 NOTE — Patient Instructions (Signed)
Below is our plan:  We will restart Vumerity. Start 1 tablet twice daily for 1 weeks then increase to two tablets twice daily. I will update labs, today.   Please make sure you are staying well hydrated. I recommend 50-60 ounces daily. Well balanced diet and regular exercise encouraged. Consistent sleep schedule with 6-8 hours recommended.   Please continue follow up with care team as directed.   Follow up with Dr Epimenio Foot in 6 months   You may receive a survey regarding today's visit. I encourage you to leave honest feed back as I do use this information to improve patient care. Thank you for seeing me today!

## 2021-05-02 NOTE — Progress Notes (Addendum)
Chief Complaint  Patient presents with   Follow-up    Rm 10, alone. Here for MS f/u, on Vumerity. Pt reportedly ran out of Vumerity 6 months ago and never reached out to request refills. Pt reports doing well. Has had some eclectic shooting pn in her R foot only. Has trouble remembering things.      HISTORY OF PRESENT ILLNESS:  05/02/21 ALL:  Diane Carson is a 55 y.o. female here today for follow up for RRMS. She was previously taking Vumerity. She reports running out of medication and discontinued about 6 months ago. She would like to resume Vumerity. Labs stable 04/2020. Last imaging 05/2017 showed concern of 1 focus in periventricular white matter not present in 2017.   She feels that she is doing well. No new or exacerbating symptoms. She continues tizanidine 8mg  TID, celecoxib 200mg  BID and gabapentin 900mg  BID for dysesthesias and generalized pain. Tizanidine helps left leg cramps, worse at night. She does not drink much water. She has had some sharp pains in her right foot that wax and wane over the past few weeks. No weakness or radiation of pain.   Adderall XR 15mg  daily (most days) for attention deficit and cognitive fog. She feels it really helps and is tolerated well but does not take every day.   Lamotrigine 100mg  BID and escitalopram 30mg  daily helps significantly with mood. She feels she is doing very well from mood perspective.   She sleeps well. Amitriptyline 25mg  QHS helps with insomnia.   She takes vitamin D 5,000iu daily.   HISTORY (copied from Dr previous note)   Diane Carson is a 55 y.o. 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   Update 04/27/2020: Her MS was doing well on generic Tecfidera and she switched to Vumerity due to better free drug coverage.   No new neurologic symptoms or exacerbations.      Gait is mildly off balanced but stable and she has no falls.   Strength is fine.  She get spasms, helped by tizanidine.  She has dysesthetic pain in the limbs and also  has sciatic type pain on her left..    She has some benefit from lamotrigine 100 mg daily and gabapentin 300 mg - 600 mg and amitriptyline.    Her vision is slightly blurry but stable.  She feels it is a bit worse when more tired.     Bladder is fine.    She has decreased focus/attention and fatigue.    Adderall has helped cognition and she tolerates it well.   Mood is doing well on escitalopram 45 mg and lamotrigine.   She has sleep maintenance insomnia.   Trazodone was switched to amitriptyline and it helps insomnia as much.   No dry mouth or other s.e.         MRI of the brain July 2020 in [facility] showed one small new right frontal white matter focus compared to MRI 2018.   MRI cervical spine showed a very small dot in the spinal cord at C6C7 (left posterolateral), not clearly present in 2017 but also seen on the 2013 MRi --- artifact vs stable tiny focus   MS History:    In 1999, she had left optic neuritis. She did not receive steroids at that time but did have an MRI of the brain that was reportedly normal. She did very well for the next 14 years. In 2013, she woke up with numbness in her feet the one day. Over the  next 2 days numbness evolved to be more intense and be in the distribution up to the mid thoracic level.   She also had decreased balance and fell a couple times. She noted that the numbness was near total of the breast. She received several days of IV steroids and symptoms improved, but not completely to baseline.    Between 2013 and 2017,  she has had no definite exacerbation. However, she has noted more difficulty with cognitive function, emotional lability, and fatigue.    MRI of the brain and spine dated 06/07/2016 and compared with the MRI dated 08/28/2011.  The older MRI shows a couple small T2/FLAIR hyperintense foci in the deep white matter and white matter. On the current study, there are about 6 or 7 more foci in the hemispheres including more in the periventricular white matter  and 2 foci in the right cerebellar hemisphere. The 2017 MRI of the cervical spine shows mild degenerative changes (with foraminal narrowing to the left at C7T1) 5but a normal spinal cord. The 2017 MRI of the thoracic spine shows a chronic plaque at T7.  Also, there is a disc protrusion at T9-T10.   The NMO Ab is negative.      IMAGING MRI of the brain July 2020 in [facility] showed one small new right frontal white matter focus compared to MRI 2018.   MRI cervical spine showed a very small dot in the spinal cord at C6C7 (left posterolateral), not clearly present in 2017 but also seen on the 2013 MRi --- artifact vs stable tiny focus   REVIEW OF SYSTEMS: Out of a complete 14 system review of symptoms, the patient complains only of the following symptoms, dysesthesias, insomnia, anxiety, depression, brain fog, inattention, cramps, and all other reviewed systems are negative.   ALLERGIES: Allergies  Allergen Reactions   Atorvastatin     Ringing of the ears, itching, heartburn   Codeine Shortness Of Breath     HOME MEDICATIONS: Outpatient Medications Prior to Visit  Medication Sig Dispense Refill   amitriptyline (ELAVIL) 25 MG tablet Take 1 tablet (25 mg total) by mouth at bedtime. Please keep upcoming appt for further refills 30 tablet 3   amphetamine-dextroamphetamine (ADDERALL XR) 15 MG 24 hr capsule Take 1 capsule by mouth every morning. 30 capsule 0   calcium carbonate (OSCAL) 1500 (600 Ca) MG TABS tablet Take 600 mg of elemental calcium by mouth 2 (two) times daily with a meal.     celecoxib (CELEBREX) 200 MG capsule TAKE 1 CAPSULE BY MOUTH 2 TIMES DAILY 180 capsule 0   cholecalciferol (VITAMIN D) 1000 units tablet Take 5,000 Units by mouth daily.     Diroximel Fumarate (VUMERITY) 231 MG CPDR Take by mouth. (Patient not taking: Reported on 05/02/2021)     escitalopram (LEXAPRO) 20 MG tablet TAKE 1 AND 1/2 TABLET BY MOUTH DAILY. Must keep follow up 07/18/21 for future refills 45 tablet 2    gabapentin (NEURONTIN) 300 MG capsule TAKE 3 CAPSULES BY MOUTH 2 TIMES A DAY 540 capsule 0   lamoTRIgine (LAMICTAL) 100 MG tablet Take 1 tablet (100 mg total) by mouth 2 (two) times daily. Must keep follow up 07/18/21 for future refills 60 tablet 2   Multiple Vitamin (MULTIVITAMIN) tablet Take 1 tablet by mouth daily.     tiZANidine (ZANAFLEX) 4 MG tablet TAKE 2 TABLETS BY MOUTH 3 TIMES DAILY **KEEP UPCOMING APPOINTMENT FOR MORE REFILLS** 180 tablet 0   alendronate (FOSAMAX) 70 MG tablet Take 70  mg by mouth once a week.      Facility-Administered Medications Prior to Visit  Medication Dose Route Frequency Provider Last Rate Last Admin   gadopentetate dimeglumine (MAGNEVIST) injection 13 mL  13 mL Intravenous Once PRN Sater, Pearletha Furl, MD         PAST MEDICAL HISTORY: Past Medical History:  Diagnosis Date   Degenerative disc disease, cervical    Multiple sclerosis (HCC)    Osteoarthritis    thumbs   Osteoporosis    Vision abnormalities      PAST SURGICAL HISTORY: Past Surgical History:  Procedure Laterality Date   BREAST BIOPSY Right    Biopsy over 20 years ago, done in [place]   JOINT REPLACEMENT Right 09/07/2016   thumb     FAMILY HISTORY: Family History  Adopted: Yes  Problem Relation Age of Onset   Colon cancer Brother      SOCIAL HISTORY: Social History   Socioeconomic History   Marital status: Married    Spouse name: Not on file   Number of children: Not on file   Years of education: Not on file   Highest education level: Not on file  Occupational History   Not on file  Tobacco Use   Smoking status: Former   Smokeless tobacco: Never  Substance and Sexual Activity   Alcohol use: No   Drug use: Yes    Types: Marijuana    Comment: daily marijuana use   Sexual activity: Not on file  Other Topics Concern   Not on file  Social History Narrative   Not on file   Social Determinants of Health   Financial Resource Strain: Not on file  Food  Insecurity: Not on file  Transportation Needs: Not on file  Physical Activity: Not on file  Stress: Not on file  Social Connections: Not on file  Intimate Partner Violence: Not on file     PHYSICAL EXAM  Vitals:   05/02/21 1005  BP: 114/77  Pulse: 71  Weight: 155 lb (70.3 kg)  Height: 5\' 5"  (1.651 m)   Body mass index is 25.79 kg/m.  Generalized: Well developed, in no acute distress  Cardiology: normal rate and rhythm, no murmur auscultated  Respiratory: clear to auscultation bilaterally    Neurological examination  Mentation: Alert oriented to time, place, history taking. Follows all commands speech and language fluent Cranial nerve II-XII: Pupils were equal round reactive to light. Extraocular movements were full, visual field were full on confrontational test. Facial sensation and strength were normal. Uvula tongue midline. Head turning and shoulder shrug  were normal and symmetric. Motor: The motor testing reveals 5 over 5 strength of all 4 extremities with exception of 4+/5 left hip flexion. Good symmetric motor tone is noted throughout.  Sensory: Sensory testing is intact to soft touch on all 4 extremities. No evidence of extinction is noted.  Coordination: Cerebellar testing reveals good finger-nose-finger and heel-to-shin bilaterally.  Gait and station: Gait is normal.    DIAGNOSTIC DATA (LABS, IMAGING, TESTING) - I reviewed patient records, labs, notes, testing and imaging myself where available.  Lab Results  Component Value Date   WBC 7.5 04/27/2020   HGB 12.6 04/27/2020   HCT 37.9 04/27/2020   MCV 90 04/27/2020   PLT 333 04/27/2020      Component Value Date/Time   NA 139 04/20/2017 1018   K 4.7 04/20/2017 1018   CL 97 04/20/2017 1018   CO2 27 04/20/2017 1018   GLUCOSE 78 04/20/2017  1018   BUN 12 04/20/2017 1018   CREATININE 0.78 04/20/2017 1018   CALCIUM 10.2 04/20/2017 1018   PROT 6.9 04/20/2017 1018   ALBUMIN 4.6 04/20/2017 1018   AST 24  04/20/2017 1018   ALT 21 04/20/2017 1018   ALKPHOS 64 04/20/2017 1018   BILITOT 0.2 04/20/2017 1018   GFRNONAA 90 04/20/2017 1018   GFRAA 103 04/20/2017 1018   No results found for: CHOL, HDL, LDLCALC, LDLDIRECT, TRIG, CHOLHDL No results found for: OVZC5Y No results found for: VITAMINB12 No results found for: TSH  No flowsheet data found.   No flowsheet data found.   ASSESSMENT AND PLAN  55 y.o. year old female  has a past medical history of Degenerative disc disease, cervical, Multiple sclerosis (HCC), Osteoarthritis, Osteoporosis, and Vision abnormalities. 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 She wishes to resume. I will have our staff contact [employer] to restart Vumerity. She will start 1 tablet twice daily for 1 week then continue maintenance dose of 2 tablets twice daily. She will continue tizanidine, celecoxib, gabapentin, Adderall, lamotrigine, escitalopram and amitriptyline as prescribed. PDMP appropriate. We will update labs, today. Will adjust vitamin D dose pending results. I will order MRI for monitoring. She was encouraged to increase water intake. Monitor left foot pain and let me know if it worsens. She will continue healthy lifestyle habits. Follow up with Dr Epimenio Foot in 6 months.    Orders Placed This Encounter  Procedures   MR BRAIN W WO CONTRAST    Standing Status:   Future    Standing Expiration Date:   05/02/2022    Order Specific Question:   If indicated for the ordered procedure, I authorize the administration of contrast media per Radiology protocol    Answer:   Yes     Order Specific Question:   What is the patient's sedation requirement?    Answer:   No Sedation    Order Specific Question:   Does the patient have a pacemaker or implanted devices?    Answer:   No    Order Specific Question:   Radiology Contrast Protocol - do NOT remove file path    Answer:   [email or web address]    Order Specific Question:   Preferred imaging location?    Answer:   Internal   CBC with Differential/Platelets   CMP   Vitamin D, 25-hydroxy      No orders of the defined types were placed in this encounter.     Shawnie Dapper, MSN, FNP-C 05/02/2021, 11:27 AM  [facility] [address], [address] [place], [place] [address] [phone]  I have read the note, and I agree with the clinical assessment and plan.  Richard A. Epimenio Foot, MD, PhD, [facility] Certified in Neurology, Clinical Neurophysiology, Sleep Medicine, Pain Medicine and Neuroimaging  [facility] [address] [place], [place] [address] [phone]

## 2021-05-02 NOTE — Progress Notes (Signed)
Sent email to Cystal P. ([employer] rep) to see if we can get pt back on free drug. Waiting on response  Response from Crystal: "We will just need a new start form & we can help her get started!  Since we already have consent on file, she does not need to sign the form. Thank you! Crystal"

## 2021-05-03 ENCOUNTER — Telehealth: Payer: Self-pay

## 2021-05-03 LAB — CBC WITH DIFFERENTIAL/PLATELET
Basophils Absolute: 0.1 x10E3/uL (ref 0.0–0.2)
Basos: 1 %
EOS (ABSOLUTE): 0.2 x10E3/uL (ref 0.0–0.4)
Eos: 2 %
Hematocrit: 39.4 % (ref 34.0–46.6)
Hemoglobin: 12.7 g/dL (ref 11.1–15.9)
Immature Grans (Abs): 0 x10E3/uL (ref 0.0–0.1)
Immature Granulocytes: 0 %
Lymphocytes Absolute: 2.9 x10E3/uL (ref 0.7–3.1)
Lymphs: 36 %
MCH: 28.6 pg (ref 26.6–33.0)
MCHC: 32.2 g/dL (ref 31.5–35.7)
MCV: 89 fL (ref 79–97)
Monocytes Absolute: 0.7 x10E3/uL (ref 0.1–0.9)
Monocytes: 9 %
Neutrophils Absolute: 4.2 x10E3/uL (ref 1.4–7.0)
Neutrophils: 52 %
Platelets: 411 x10E3/uL (ref 150–450)
RBC: 4.44 x10E6/uL (ref 3.77–5.28)
RDW: 13.7 % (ref 11.7–15.4)
WBC: 8 x10E3/uL (ref 3.4–10.8)

## 2021-05-03 LAB — COMPREHENSIVE METABOLIC PANEL WITH GFR
ALT: 15 IU/L (ref 0–32)
AST: 20 IU/L (ref 0–40)
Albumin/Globulin Ratio: 2 (ref 1.2–2.2)
Albumin: 4.6 g/dL (ref 3.8–4.9)
Alkaline Phosphatase: 93 IU/L (ref 44–121)
BUN/Creatinine Ratio: 15 (ref 9–23)
BUN: 13 mg/dL (ref 6–24)
Bilirubin Total: <0.2 mg/dL (ref 0.0–1.2)
CO2: 25 mmol/L (ref 20–29)
Calcium: 9.5 mg/dL (ref 8.7–10.2)
Chloride: 103 mmol/L (ref 96–106)
Creatinine, Ser: 0.85 mg/dL (ref 0.57–1.00)
Globulin, Total: 2.3 g/dL (ref 1.5–4.5)
Glucose: 82 mg/dL (ref 70–99)
Potassium: 4.6 mmol/L (ref 3.5–5.2)
Sodium: 141 mmol/L (ref 134–144)
Total Protein: 6.9 g/dL (ref 6.0–8.5)
eGFR: 82 mL/min/1.73

## 2021-05-03 LAB — VITAMIN D 25 HYDROXY (VIT D DEFICIENCY, FRACTURES): Vit D, 25-Hydroxy: 51.4 ng/mL (ref 30.0–100.0)

## 2021-05-03 NOTE — Telephone Encounter (Signed)
-----   Message from Shawnie Dapper, NP sent at 05/03/2021  7:20 AM EDT ----- Labs are normal.

## 2021-05-03 NOTE — Progress Notes (Signed)
See telephone note from 05/03/21.

## 2021-05-03 NOTE — Telephone Encounter (Signed)
Called and relayed results per Amy, NP. Pt appreciated call and had no questions at this time.

## 2021-05-03 NOTE — Telephone Encounter (Signed)
Received Vumerity Start Form. Faxed to [employer]. Received a receipt of confirmation.  There is no insurance on file. I called patient. She confirmed that she is uninsured.  I will let [employer] know to screen her  for the Free Drug Program.

## 2021-05-04 ENCOUNTER — Other Ambulatory Visit: Payer: Self-pay | Admitting: Family Medicine

## 2021-05-04 MED ORDER — ALPRAZOLAM 0.5 MG PO TABS: 0.5000 mg | ORAL_TABLET | Freq: Every evening | ORAL | 0 refills | Status: DC | PRN

## 2021-05-04 NOTE — Telephone Encounter (Signed)
MR Brain w/wo contrast Diane Carson self pay. Patient is scheduled at [facility] for 05/11/21  Patient also informed me she is claustrophobic and would need something to help her, she is aware to have a driver.

## 2021-05-04 NOTE — Telephone Encounter (Signed)
Order for alprazolam has been ordered and will be sent to the pharmacy for the patient. Will send to Amy to review and send for the patient.

## 2021-05-11 ENCOUNTER — Ambulatory Visit: Payer: Self-pay

## 2021-05-11 DIAGNOSIS — G35 Multiple sclerosis: Secondary | ICD-10-CM

## 2021-05-11 MED ORDER — GADOBENATE DIMEGLUMINE 529 MG/ML IV SOLN
15.0000 mL | Freq: Once | INTRAVENOUS | Status: AC | PRN
  Administered 2021-05-11: 15 mL via INTRAVENOUS

## 2021-05-12 ENCOUNTER — Telehealth: Payer: Self-pay

## 2021-05-12 NOTE — Telephone Encounter (Signed)
Called and relayed message per Amy,NP of results. Pt verbalized understanding and had no further questions at this time.

## 2021-05-12 NOTE — Telephone Encounter (Signed)
-----   Message from Shawnie Dapper, NP sent at 05/12/2021  9:14 AM EST ----- Please let her know that her MRI was stable. No new lesions! We will continue current treatment plan.

## 2021-05-12 NOTE — Telephone Encounter (Signed)
Patient was approved for the Free Drug Program for Vumerity through [employer]. RX for vumerity titration and maintenance sent to [facility].

## 2021-05-12 NOTE — Progress Notes (Signed)
See telephone note from 05/12/21.

## 2021-05-17 NOTE — Telephone Encounter (Signed)
Received fax letter from Free Drug Program stating medication has been shipped .  Direct line to the dispensing  pharmacy and point of contact for future refills.  [phone]

## 2021-05-25 NOTE — Telephone Encounter (Signed)
I called patient. She has been taking vumerity 231mg  BID and tolerating it well. She starts vumerity 462mg  BID tomorrow. She will keep her appt as scheduled in May of 2023. She will call with interim questions or concerns.

## 2021-06-06 ENCOUNTER — Telehealth: Payer: Self-pay | Admitting: Family Medicine

## 2021-06-06 ENCOUNTER — Other Ambulatory Visit: Payer: Self-pay | Admitting: Neurology

## 2021-06-06 MED ORDER — AMPHETAMINE-DEXTROAMPHET ER 15 MG PO CP24: 15.0000 mg | ORAL_CAPSULE | ORAL | 0 refills | Status: DC

## 2021-06-06 MED ORDER — TIZANIDINE HCL 4 MG PO TABS: ORAL_TABLET | ORAL | 5 refills | Status: DC

## 2021-06-06 NOTE — Telephone Encounter (Signed)
Pt is requesting a refill for amphetamine-dextroamphetamine (ADDERALL XR) 15 MG 24 hr capsule & tiZANidine (ZANAFLEX) 4 MG tablet .  [facility]acy: [facility]

## 2021-06-06 NOTE — Telephone Encounter (Signed)
I have routed this request to Dr Sater for review. The pt is due for the medication and [place] registry was verified.  

## 2021-07-08 ENCOUNTER — Other Ambulatory Visit: Payer: Self-pay | Admitting: Family Medicine

## 2021-07-08 NOTE — Telephone Encounter (Signed)
Pt is requesting a refill for amphetamine-dextroamphetamine (ADDERALL XR) 15 MG 24 hr capsule.  Pharmacy: [facility] Pt confirmed no changes to her insurance with the [employer]

## 2021-07-11 MED ORDER — AMPHETAMINE-DEXTROAMPHET ER 15 MG PO CP24: 15.0000 mg | ORAL_CAPSULE | ORAL | 0 refills | Status: DC

## 2021-07-18 ENCOUNTER — Ambulatory Visit: Payer: 59 | Admitting: Family Medicine

## 2021-07-31 ENCOUNTER — Other Ambulatory Visit: Payer: Self-pay | Admitting: Neurology

## 2021-08-24 ENCOUNTER — Other Ambulatory Visit: Payer: Self-pay | Admitting: Family Medicine

## 2021-08-24 NOTE — Telephone Encounter (Signed)
At 4:20 pt left a vm asking for a refill on her amphetamine-dextroamphetamine (ADDERALL XR) 15 MG 24 hr capsule to [facility] 341

## 2021-08-25 MED ORDER — AMPHETAMINE-DEXTROAMPHET ER 15 MG PO CP24: 15.0000 mg | ORAL_CAPSULE | ORAL | 0 refills | Status: DC

## 2021-09-26 ENCOUNTER — Other Ambulatory Visit: Payer: Self-pay | Admitting: Family Medicine

## 2021-09-26 MED ORDER — AMPHETAMINE-DEXTROAMPHET ER 15 MG PO CP24: 15.0000 mg | ORAL_CAPSULE | ORAL | 0 refills | Status: DC

## 2021-09-26 NOTE — Telephone Encounter (Signed)
Pt is requesting a refill for amphetamine-dextroamphetamine (ADDERALL XR) 15 MG 24 hr capsule. ? ?Pharmacy: [facility] 341 ? ?

## 2021-10-18 ENCOUNTER — Telehealth: Payer: Self-pay | Admitting: *Deleted

## 2021-10-18 NOTE — Telephone Encounter (Signed)
Placed completed form in mail to [facility]. Pt provided self addressed envelope to send out once form completed. ? ? ?

## 2021-10-26 ENCOUNTER — Other Ambulatory Visit: Payer: Self-pay | Admitting: Neurology

## 2021-10-27 ENCOUNTER — Other Ambulatory Visit: Payer: Self-pay | Admitting: Family Medicine

## 2021-10-27 MED ORDER — AMPHETAMINE-DEXTROAMPHET ER 15 MG PO CP24: 15.0000 mg | ORAL_CAPSULE | ORAL | 0 refills | Status: DC

## 2021-10-27 NOTE — Telephone Encounter (Signed)
Pt is requesting a refill for amphetamine-dextroamphetamine (ADDERALL XR) 15 MG 24 hr capsule. ? ?Pharmacy: [facility] 341 ? ? ?

## 2021-10-31 ENCOUNTER — Encounter: Payer: Self-pay | Admitting: Neurology

## 2021-10-31 ENCOUNTER — Ambulatory Visit (INDEPENDENT_AMBULATORY_CARE_PROVIDER_SITE_OTHER): Payer: Self-pay | Admitting: Neurology

## 2021-10-31 VITALS — BP 110/76 | HR 87 | Ht 65.0 in | Wt 150.5 lb

## 2021-10-31 DIAGNOSIS — F418 Other specified anxiety disorders: Secondary | ICD-10-CM

## 2021-10-31 DIAGNOSIS — R5383 Other fatigue: Secondary | ICD-10-CM

## 2021-10-31 DIAGNOSIS — Z79899 Other long term (current) drug therapy: Secondary | ICD-10-CM

## 2021-10-31 DIAGNOSIS — G35 Multiple sclerosis: Secondary | ICD-10-CM

## 2021-10-31 DIAGNOSIS — R208 Other disturbances of skin sensation: Secondary | ICD-10-CM

## 2021-10-31 DIAGNOSIS — R4189 Other symptoms and signs involving cognitive functions and awareness: Secondary | ICD-10-CM

## 2021-10-31 DIAGNOSIS — R269 Unspecified abnormalities of gait and mobility: Secondary | ICD-10-CM

## 2021-10-31 DIAGNOSIS — G4700 Insomnia, unspecified: Secondary | ICD-10-CM

## 2021-10-31 NOTE — Progress Notes (Signed)
? ?[facility] ? ?PATIENT: Diane Carson ?DOB: [birth year] ? ?REFERRING DOCTOR OR PCP:  Referred by Dr. Stacy Gardner. Her PCP is Guerry Bruin. ?SOURCE: Patient, notes from Dr. Anne Hahn, MRI and lab reports, MRI images on PACS. ? ?_________________________________ ? ? ?HISTORICAL ? ?CHIEF COMPLAINT:  ?Chief Complaint  ?Patient presents with  ? Follow-up  ?  Rm 1, alone. Here for 6 month MS f/u, on Vumerity and tolerating well.   ? ? ?HISTORY OF PRESENT ILLNESS:  ? Diane Carson is a 56 y.o. 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 ? ?Update 10/31/2021: ?She is feeling very well in general.  Her MS was doing well on generic Tecfidera and she switched to Vumerity due to better free drug coverage.   No new neurologic symptoms or exacerbations.    ? ?Her gait is a little off balanced and she has had a few falls, sometimes due to a lef foot drop.  Gait is worse with longer walks.    Strength is fine.  Spasms are much better with tizanidine.  The dysesthetic pain in the limbs sciatic type pain on her left are much better with lamotrigine 100 mg daily and gabapentin 300 mg - 600 mg and amitriptyline.    Her vision is slightly blurry but stable.  She feels it is a bit worse when more tired.     Bladder is fine.  ? ?She has decreased focus/attention and fatigue.    Adderall has helped cognition and she tolerates it well.   She feels much worse if she skips a day.   Mood is doing well on escitalopram 20 mg and lamotrigine 100 mg po bid.   She has sleep maintenance insomnia doing well on amitriptyline   No dry mouth or other s.e.       ? ?She works Clinical cytogeneticist a Teaching laboratory technician.    ?  ?MRI of the brain July 2020 in [facility] showed one small new right frontal white matter focus compared to MRI 2018.   MRI cervical spine showed a very small dot in the spinal cord at C6C7 (left posterolateral), not clearly present in 2017 but also seen on the 2013 MRi --- artifact vs stable tiny focus ? ?MRI brain 05/11/2021 showed Scattered  T2/FLAIR hyperintense foci in the hemispheres in a pattern consistent with chronic demyelinating plaque associated with multiple sclerosis.  None of the foci enhanced.  Compared to the MRI from 12/25/2018, there are no new lesions. ?  ? ?MS History:    In 1999, she had left optic neuritis. She did not receive steroids at that time but did have an MRI of the brain that was reportedly normal. She did very well for the next 14 years. In 2013, she woke up with numbness in her feet the one day. Over the next 2 days numbness evolved to be more intense and be in the distribution up to the mid thoracic level.   She also had decreased balance and fell a couple times. She noted that the numbness was near total of the breast. She received several days of IV steroids and symptoms improved, but not completely to baseline.    Between 2013 and 2017,  she has had no definite exacerbation. However, she has noted more difficulty with cognitive function, emotional lability, and fatigue.    MRI of the brain and spine dated 06/07/2016 and compared with the MRI dated 08/28/2011.  The older MRI shows a couple small T2/FLAIR hyperintense foci in the deep white matter and white  matter. On the current study, there are about 6 or 7 more foci in the hemispheres including more in the periventricular white matter and 2 foci in the right cerebellar hemisphere. The 2017 MRI of the cervical spine shows mild degenerative changes (with foraminal narrowing to the left at C7T1) 5but a normal spinal cord. The 2017 MRI of the thoracic spine shows a chronic plaque at T7.  Also, there is a disc protrusion at T9-T10.   The NMO Ab is negative.    ? ?IMAGING ?MRI of the brain July 2020 in [facility] showed one small new right frontal white matter focus compared to MRI 2018.   MRI cervical spine showed a very small dot in the spinal cord at C6C7 (left posterolateral), not clearly present in 2017 but also seen on the 2013 MRi --- artifact vs stable tiny  focus ? ?REVIEW OF SYSTEMS: ?Constitutional: No fevers, chills, sweats, or change in appetite.   She has Fatigue ?Eyes: Notes occ blurred vision.  N0 double vision, eye pain ?Ear, nose and throat: No hearing loss, ear pain, nasal congestion, sore throat ?Cardiovascular: No chest pain, palpitations ?Respiratory:  No shortness of breath at rest or with exertion.   No wheezes ?GastrointestinaI: No nausea, vomiting, diarrhea, abdominal pain, fecal incontinence ?Genitourinary:  No dysuria, urinary retention or frequency.  No nocturia. ?Musculoskeletal:  No neck pain, back pain ?Integumentary: No rash, pruritus, skin lesions ?Neurological: as above ?Psychiatric: Notes some depression and anxiety.  She is going through a divorce,  ?Endocrine: No palpitations, diaphoresis, change in appetite, change in weigh or increased thirst ?Hematologic/Lymphatic:  No anemia, purpura, petechiae. ?Allergic/Immunologic: No itchy/runny eyes, nasal congestion, recent allergic reactions, rashes ? ?ALLERGIES: ?Allergies  ?Allergen Reactions  ? Atorvastatin   ?  Ringing of the ears, itching, heartburn  ? Codeine Shortness Of Breath  ? ? ?HOME MEDICATIONS: ? ?Current Outpatient Medications:  ?  ALPRAZolam (XANAX) 0.5 MG tablet, Take 1 tablet (0.5 mg total) by mouth at bedtime as needed for anxiety (Take 1 tablet 30 min prior to the MRI procedure. Take additional tablet if needed at the time of the MRI. (will need a driver))., Disp: 2 tablet, Rfl: 0 ?  amitriptyline (ELAVIL) 25 MG tablet, Take 1 tablet (25 mg total) by mouth at bedtime. Must keep upcoming appt (10/31/21)for continued refills, Disp: 30 tablet, Rfl: 3 ?  amphetamine-dextroamphetamine (ADDERALL XR) 15 MG 24 hr capsule, Take 1 capsule by mouth every morning., Disp: 30 capsule, Rfl: 0 ?  calcium carbonate (OSCAL) 1500 (600 Ca) MG TABS tablet, Take 600 mg of elemental calcium by mouth 2 (two) times daily with a meal., Disp: , Rfl:  ?  celecoxib (CELEBREX) 200 MG capsule, TAKE ONE  CAPSULE BY MOUTH TWICE A DAY, Disp: 180 capsule, Rfl: 1 ?  cholecalciferol (VITAMIN D) 1000 units tablet, Take 5,000 Units by mouth daily., Disp: , Rfl:  ?  Diroximel Fumarate (VUMERITY) 231 MG CPDR, Take 462 mg by mouth 2 (two) times daily., Disp: , Rfl:  ?  escitalopram (LEXAPRO) 20 MG tablet, TAKE 1 AND 1/2 TABLET BY MOUTH DAILY. Must keep upcoming appt (10/31/21)for continued refills, Disp: 45 tablet, Rfl: 3 ?  gabapentin (NEURONTIN) 300 MG capsule, TAKE 3 CAPSULES BY MOUTH TWICE A DAY, Disp: 540 capsule, Rfl: 1 ?  lamoTRIgine (LAMICTAL) 100 MG tablet, Take 1 tablet (100 mg total) by mouth 2 (two) times daily. Must keep upcoming appt (10/31/21)for continued refills, Disp: 60 tablet, Rfl: 3 ?  Multiple Vitamin (MULTIVITAMIN) tablet,  Take 1 tablet by mouth daily., Disp: , Rfl:  ?  tiZANidine (ZANAFLEX) 4 MG tablet, TAKE 2 TABLETS BY MOUTH 3 TIMES DAILY, Disp: 180 tablet, Rfl: 5 ?No current facility-administered medications for this visit. ? ?Facility-Administered Medications Ordered in Other Visits:  ?  gadopentetate dimeglumine (MAGNEVIST) injection 13 mL, 13 mL, Intravenous, Once PRN, Tyan Lasure, Pearletha Furl, MD ? ?PAST MEDICAL HISTORY: ?Past Medical History:  ?Diagnosis Date  ? Degenerative disc disease, cervical   ? Multiple sclerosis (HCC)   ? Osteoarthritis   ? thumbs  ? Osteoporosis   ? Vision abnormalities   ? ? ?PAST SURGICAL HISTORY: ?Past Surgical History:  ?Procedure Laterality Date  ? BREAST BIOPSY Right   ? Biopsy over 20 years ago, done in [place]  ? JOINT REPLACEMENT Right 09/07/2016  ? thumb  ? ? ?FAMILY HISTORY: ?Family History  ?Adopted: Yes  ?Problem Relation Age of Onset  ? Colon cancer Brother   ? ? ?SOCIAL HISTORY: ? ?Social History  ? ?Socioeconomic History  ? Marital status: Married  ?  Spouse name: Not on file  ? Number of children: Not on file  ? Years of education: Not on file  ? Highest education level: Not on file  ?Occupational History  ? Not on file  ?Tobacco Use  ? Smoking status: Former   ? Smokeless tobacco: Never  ?Substance and Sexual Activity  ? Alcohol use: No  ? Drug use: Yes  ?  Types: Marijuana  ?  Comment: daily marijuana use  ? Sexual activity: Not on file  ?Other Topics Concern  ? Not on file

## 2021-11-01 ENCOUNTER — Telehealth: Payer: Self-pay | Admitting: *Deleted

## 2021-11-01 LAB — CBC WITH DIFFERENTIAL/PLATELET
Basophils Absolute: 0.1 10*3/uL (ref 0.0–0.2)
Basos: 1 %
EOS (ABSOLUTE): 0.2 10*3/uL (ref 0.0–0.4)
Eos: 2 %
Hematocrit: 41.9 % (ref 34.0–46.6)
Hemoglobin: 14 g/dL (ref 11.1–15.9)
Immature Grans (Abs): 0 10*3/uL (ref 0.0–0.1)
Immature Granulocytes: 0 %
Lymphocytes Absolute: 2.4 10*3/uL (ref 0.7–3.1)
Lymphs: 32 %
MCH: 28.8 pg (ref 26.6–33.0)
MCHC: 33.4 g/dL (ref 31.5–35.7)
MCV: 86 fL (ref 79–97)
Monocytes Absolute: 0.7 10*3/uL (ref 0.1–0.9)
Monocytes: 9 %
Neutrophils Absolute: 4.2 10*3/uL (ref 1.4–7.0)
Neutrophils: 56 %
Platelets: 434 10*3/uL (ref 150–450)
RBC: 4.86 x10E6/uL (ref 3.77–5.28)
RDW: 13.7 % (ref 11.7–15.4)
WBC: 7.5 10*3/uL (ref 3.4–10.8)

## 2021-11-01 NOTE — Telephone Encounter (Signed)
-----   Message from Asa Lente, MD sent at 11/01/2021  5:02 PM EDT ----- ?Please let the patient know that the lab work is fine. ? ?

## 2021-11-01 NOTE — Telephone Encounter (Signed)
Called and LVM for pt about lab results. Advised she did not have to call back unless she had further questions. ?

## 2021-12-03 ENCOUNTER — Other Ambulatory Visit: Payer: Self-pay | Admitting: Neurology

## 2021-12-04 ENCOUNTER — Other Ambulatory Visit: Payer: Self-pay | Admitting: Neurology

## 2021-12-07 ENCOUNTER — Other Ambulatory Visit: Payer: Self-pay | Admitting: Neurology

## 2021-12-07 MED ORDER — AMPHETAMINE-DEXTROAMPHET ER 15 MG PO CP24: 15.0000 mg | ORAL_CAPSULE | ORAL | 0 refills | Status: DC

## 2021-12-07 NOTE — Telephone Encounter (Signed)
Pt is requesting a refill for amphetamine-dextroamphetamine (ADDERALL XR) 15 MG 24 hr capsule.  Pharmacy: [facility] [address]

## 2021-12-07 NOTE — Telephone Encounter (Signed)
Checked drug registry. Last refilled 11/01/21 #30. Last seen 10/31/21 and next f/u 05/04/22.

## 2021-12-28 ENCOUNTER — Telehealth: Payer: Self-pay | Admitting: Neurology

## 2021-12-28 MED ORDER — AMPHETAMINE-DEXTROAMPHETAMINE 10 MG PO TABS: 10.0000 mg | ORAL_TABLET | Freq: Two times a day (BID) | ORAL | 0 refills | Status: DC

## 2021-12-28 NOTE — Telephone Encounter (Signed)
Called the pharmacy. They do not have the er in stock they have the immediate release. He advised he has the 10 mg tablet. I will see if Dr Epimenio Foot would like to order this until hopefully they can get the medication in stock.

## 2021-12-28 NOTE — Telephone Encounter (Signed)
Pt saidHarris Waldo Carson said  was out stock for  amphetamine-dextroamphetamine (ADDERALL XR) 15 MG 24 hr capsule . Checked at [facility], said they have the salt. Looking for where can get it or is there an alterative. Would like a call from the nurse.

## 2021-12-29 NOTE — Telephone Encounter (Signed)
Called the pt to inform her that the pharmacy only had the IR form and advised of the medication change due to the shortage.  Pt is agreeable to this and advised we will assess on a monthly basis depending on pharmacy stock. Pt verbalized understanding.

## 2022-01-24 ENCOUNTER — Other Ambulatory Visit: Payer: Self-pay | Admitting: *Deleted

## 2022-01-24 MED ORDER — AMPHETAMINE-DEXTROAMPHETAMINE 10 MG PO TABS: 10.0000 mg | ORAL_TABLET | Freq: Two times a day (BID) | ORAL | 0 refills | Status: DC

## 2022-01-26 ENCOUNTER — Other Ambulatory Visit: Payer: Self-pay | Admitting: Neurology

## 2022-01-26 MED ORDER — AMPHETAMINE-DEXTROAMPHETAMINE 10 MG PO TABS: 10.0000 mg | ORAL_TABLET | Freq: Two times a day (BID) | ORAL | 0 refills | Status: DC

## 2022-01-26 NOTE — Telephone Encounter (Signed)
Pt is requesting a refill on amphetamine-dextroamphetamine (ADDERALL) 10 MG tablet. Pt is requesting this be sent to [facility] [phone]

## 2022-02-27 ENCOUNTER — Other Ambulatory Visit: Payer: Self-pay | Admitting: Neurology

## 2022-02-27 MED ORDER — AMPHETAMINE-DEXTROAMPHETAMINE 10 MG PO TABS: 10.0000 mg | ORAL_TABLET | Freq: Two times a day (BID) | ORAL | 0 refills | Status: DC

## 2022-02-27 NOTE — Telephone Encounter (Signed)
Pt request refill for amphetamine-dextroamphetamine (ADDERALL) 10 MG tablet at [facility] [phone]

## 2022-03-30 ENCOUNTER — Other Ambulatory Visit: Payer: Self-pay | Admitting: Neurology

## 2022-03-30 MED ORDER — AMPHETAMINE-DEXTROAMPHETAMINE 10 MG PO TABS: 10.0000 mg | ORAL_TABLET | Freq: Two times a day (BID) | ORAL | 0 refills | Status: DC

## 2022-03-30 NOTE — Telephone Encounter (Signed)
Pt is requesting a refill for amphetamine-dextroamphetamine (ADDERALL) 10 MG tablet.  Pharmacy: [facility] [id number]

## 2022-04-27 ENCOUNTER — Other Ambulatory Visit: Payer: Self-pay | Admitting: Neurology

## 2022-05-01 NOTE — Progress Notes (Signed)
Chief Complaint  Patient presents with   Follow-up    Pt in room # 6 and alone. Pt here today for f/u Vumerity for her MS. Pt state lately she has had falls, tried and balance issues.     HISTORY OF PRESENT ILLNESS:  05/04/22 ALL:  Diane Carson is a 56 y.o. female here today for follow up for RRMS. She continues Vumerity. Labs stable 10/2021. Last imaging 05/2021 stable.    She feels that she is doing fairly well. No new or exacerbating symptoms. She does feel that she is having more trouble with balance. Unsure how to explain to me but feels that she has a harder time with keeping balance. She has had multiple falls over the past 6 months. She is not using an assistive device. No injuries.   She continues tizanidine 8mg  TID, celecoxib 200mg  BID and gabapentin 900mg  BID for dysesthesias and generalized pain. Tizanidine helps left leg cramps, worse at night. She is trying to do better with water intake. She drinks at least 32-40 ounces daily.   She does continues to note fatigue. She was switched to Adderall 10mg  BID from Adderall XR 15mg  daily due to supply issues. She feels that XR dosing worked better for attention deficit and cognitive fog. She feels it really helps and is tolerated well but does not take every day.   Lamotrigine 100mg  BID and escitalopram 30mg  daily helps significantly with mood. She feels she is doing very well from mood perspective.   She sleeps well. Amitriptyline 25mg  QHS helps with insomnia. She feels that she can not sleep without taking.   She takes vitamin D 5,000iu daily.   HISTORY (copied from Dr Bonnita Hollow previous note)   Diane Carson is a 56 y.o. 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   Update 10/31/2021: She is feeling very well in general.  Her MS was doing well on generic Tecfidera and she switched to Vumerity due to better free drug coverage.   No new neurologic symptoms or exacerbations.      Her gait is a little off balanced and she has had a few falls,  sometimes due to a lef foot drop.  Gait is worse with longer walks.    Strength is fine.  Spasms are much better with tizanidine.  The dysesthetic pain in the limbs sciatic type pain on her left are much better with lamotrigine 100 mg daily and gabapentin 300 mg - 600 mg and amitriptyline.    Her vision is slightly blurry but stable.  She feels it is a bit worse when more tired.     Bladder is fine.    She has decreased focus/attention and fatigue.    Adderall has helped cognition and she tolerates it well.   She feels much worse if she skips a day.   Mood is doing well on escitalopram 20 mg and lamotrigine 100 mg po bid.   She has sleep maintenance insomnia doing well on amitriptyline   No dry mouth or other s.e.         She works Clinical cytogeneticist a Teaching laboratory technician.      MRI of the brain July 2020 in [facility] showed one small new right frontal white matter focus compared to MRI 2018.   MRI cervical spine showed a very small dot in the spinal cord at C6C7 (left posterolateral), not clearly present in 2017 but also seen on the 2013 MRi --- artifact vs stable tiny focus   MRI brain 05/11/2021 showed  Scattered T2/FLAIR hyperintense foci in the hemispheres in a pattern consistent with chronic demyelinating plaque associated with multiple sclerosis.  None of the foci enhanced.  Compared to the MRI from 12/25/2018, there are no new lesions.   MS History:    In 1999, she had left optic neuritis. She did not receive steroids at that time but did have an MRI of the brain that was reportedly normal. She did very well for the next 14 years. In 2013, she woke up with numbness in her feet the one day. Over the next 2 days numbness evolved to be more intense and be in the distribution up to the mid thoracic level.   She also had decreased balance and fell a couple times. She noted that the numbness was near total of the breast. She received several days of IV steroids and symptoms improved, but not completely to baseline.     Between 2013 and 2017,  she has had no definite exacerbation. However, she has noted more difficulty with cognitive function, emotional lability, and fatigue.    MRI of the brain and spine dated 06/07/2016 and compared with the MRI dated 08/28/2011.  The older MRI shows a couple small T2/FLAIR hyperintense foci in the deep white matter and white matter. On the current study, there are about 6 or 7 more foci in the hemispheres including more in the periventricular white matter and 2 foci in the right cerebellar hemisphere. The 2017 MRI of the cervical spine shows mild degenerative changes (with foraminal narrowing to the left at C7T1) 5but a normal spinal cord. The 2017 MRI of the thoracic spine shows a chronic plaque at T7.  Also, there is a disc protrusion at T9-T10.   The NMO Ab is negative.      IMAGING [facility] showed one small new right frontal white matter focus compared to MRI 2018.   MRI cervical spine showed a very small dot in the spinal cord at C6C7 (left posterolateral), not clearly present in 2017 but also seen on the 2013 MRi --- artifact vs stable tiny focus   REVIEW OF SYSTEMS: Out of a complete 14 system review of symptoms, the patient complains only of the following symptoms, dysesthesias, insomnia, anxiety, depression, brain fog, inattention, cramps, and all other reviewed systems are negative.   ALLERGIES: Allergies  Allergen Reactions   Atorvastatin     Ringing of the ears, itching, heartburn   Codeine Shortness Of Breath     HOME MEDICATIONS: Outpatient Medications Prior to Visit  Medication Sig Dispense Refill   ALPRAZolam (XANAX) 0.5 MG tablet Take 1 tablet (0.5 mg total) by mouth at bedtime as needed for anxiety (Take 1 tablet 30 min prior to the MRI procedure. Take additional tablet if needed at the time of the MRI. (will need a driver)). 2 tablet 0   amitriptyline (ELAVIL) 25 MG tablet Take 1 tablet (25 mg total) by mouth at bedtime. 90  tablet 3   amphetamine-dextroamphetamine (ADDERALL) 10 MG tablet Take 1 tablet (10 mg total) by mouth 2 (two) times daily with a meal. 60 tablet 0   calcium carbonate (OSCAL) 1500 (600 Ca) MG TABS tablet Take 600 mg of elemental calcium by mouth 2 (two) times daily with a meal.     celecoxib (CELEBREX) 200 MG capsule TAKE ONE CAPSULE BY MOUTH TWICE A DAY 180 capsule 1   cholecalciferol (VITAMIN D) 1000 units tablet Take 5,000 Units by mouth daily.  Diroximel Fumarate (VUMERITY) 231 MG CPDR Take 462 mg by mouth 2 (two) times daily. 120 capsule 1   escitalopram (LEXAPRO) 20 MG tablet Take 1.5 tablets (30 mg total) by mouth daily. 137 tablet 3   gabapentin (NEURONTIN) 300 MG capsule TAKE 3 CAPSULES BY MOUTH TWICE A DAY 540 capsule 1   lamoTRIgine (LAMICTAL) 100 MG tablet Take 1 tablet (100 mg total) by mouth 2 (two) times daily. 180 tablet 3   Multiple Vitamin (MULTIVITAMIN) tablet Take 1 tablet by mouth daily.     tiZANidine (ZANAFLEX) 4 MG tablet Take 2 tablets (8 mg total) by mouth 3 (three) times daily. TAKE TWO TABLETS BY MOUTH THREE TIMES A DAY 180 tablet 11   Facility-Administered Medications Prior to Visit  Medication Dose Route Frequency Provider Last Rate Last Admin   gadopentetate dimeglumine (MAGNEVIST) injection 13 mL  13 mL Intravenous Once PRN Sater, Pearletha Furl, MD         PAST MEDICAL HISTORY: Past Medical History:  Diagnosis Date   Degenerative disc disease, cervical    Multiple sclerosis (HCC)    Osteoarthritis    thumbs   Osteoporosis    Vision abnormalities      PAST SURGICAL HISTORY: Past Surgical History:  Procedure Laterality Date   BREAST BIOPSY Right    Biopsy over 20 years ago, done in [place]   JOINT REPLACEMENT Right 09/07/2016   thumb     FAMILY HISTORY: Family History  Adopted: Yes  Problem Relation Age of Onset   Colon cancer Brother      SOCIAL HISTORY: Social History   Socioeconomic History   Marital status: Married    Spouse  name: Not on file   Number of children: Not on file   Years of education: Not on file   Highest education level: Not on file  Occupational History   Not on file  Tobacco Use   Smoking status: Former   Smokeless tobacco: Never  Substance and Sexual Activity   Alcohol use: No   Drug use: Yes    Types: Marijuana    Comment: daily marijuana use   Sexual activity: Not on file  Other Topics Concern   Not on file  Social History Narrative   Not on file   Social Determinants of Health   Financial Resource Strain: Not on file  Food Insecurity: Not on file  Transportation Needs: Not on file  Physical Activity: Not on file  Stress: Not on file  Social Connections: Not on file  Intimate Partner Violence: Not on file     PHYSICAL EXAM  Vitals:   05/04/22 1105  BP: 122/80  Pulse: 74  Weight: 147 lb 9.6 oz (67 kg)  Height: 5\' 5"  (1.651 m)    Body mass index is 24.56 kg/m.  Generalized: Well developed, in no acute distress  Cardiology: normal rate and rhythm, no murmur auscultated  Respiratory: clear to auscultation bilaterally    Neurological examination  Mentation: Alert oriented to time, place, history taking. Follows all commands speech and language fluent Cranial nerve II-XII: Pupils were equal round reactive to light. Extraocular movements were full, visual field were full on confrontational test. Facial sensation and strength were normal. Uvula tongue midline. Head turning and shoulder shrug  were normal and symmetric. Motor: The motor testing reveals 5 over 5 strength of all 4 extremities with exception of 4+/5 left hip flexion. Good symmetric motor tone is noted throughout.  Sensory: Sensory testing is intact to soft touch on  all 4 extremities. No evidence of extinction is noted.  Coordination: Cerebellar testing reveals good finger-nose-finger and heel-to-shin bilaterally.  Gait and station: Gait is normal.    DIAGNOSTIC DATA (LABS, IMAGING, TESTING) - I reviewed  patient records, labs, notes, testing and imaging myself where available.  Lab Results  Component Value Date   WBC 7.5 10/31/2021   HGB 14.0 10/31/2021   HCT 41.9 10/31/2021   MCV 86 10/31/2021   PLT 434 10/31/2021      Component Value Date/Time   NA 141 05/02/2021 1028   K 4.6 05/02/2021 1028   CL 103 05/02/2021 1028   CO2 25 05/02/2021 1028   GLUCOSE 82 05/02/2021 1028   BUN 13 05/02/2021 1028   CREATININE 0.85 05/02/2021 1028   CALCIUM 9.5 05/02/2021 1028   PROT 6.9 05/02/2021 1028   ALBUMIN 4.6 05/02/2021 1028   AST 20 05/02/2021 1028   ALT 15 05/02/2021 1028   ALKPHOS 93 05/02/2021 1028   BILITOT <0.2 05/02/2021 1028   GFRNONAA 90 04/20/2017 1018   GFRAA 103 04/20/2017 1018   No results found for: "CHOL", "HDL", "LDLCALC", "LDLDIRECT", "TRIG", "CHOLHDL" No results found for: "HGBA1C" No results found for: "VITAMINB12" No results found for: "TSH"      No data to display               No data to display           ASSESSMENT AND PLAN  56 y.o. year old female  has a past medical history of Degenerative disc disease, cervical, Multiple sclerosis (HCC), Osteoarthritis, Osteoporosis, and Vision abnormalities. here with    Relapsing remitting multiple sclerosis (HCC) - Plan: CBC with Differential/Platelets, Hepatic Function Panel  Vitamin D deficiency - Plan: CANCELED: Vitamin D, 25-hydroxy  Hyperlipidemia, unspecified hyperlipidemia type - Plan: Lipid panel, Ambulatory Referral to Primary Care  Does not have primary care provider - Plan: Ambulatory Referral to Primary Care  Diane Carson is doing well. She will continue Vumerity as prescribed. We will update labs, today. She does not have PCP and requests we update lipid panel. She is uninsured and understands she will be billed through the lab. Referral to PCP placed. She will continue tizanidine, celecoxib, gabapentin, Adderall, lamotrigine, escitalopram and amitriptyline as prescribed. Will switch her back  to Adderall XR 15mg  daily. PDMP appropriate. PT referral offered but declined. She was encouraged to use an assistive device. Fall precautions reviewed. She will continue healthy lifestyle habits. Follow up with Dr Epimenio Foot in 6 months.    Orders Placed This Encounter  Procedures   CBC with Differential/Platelets   Hepatic Function Panel   Lipid panel   Ambulatory Referral to Primary Care    Referral Priority:   Routine    Referral Type:   Consultation    Referral Reason:   Specialty Services Required    Number of Visits Requested:   1      No orders of the defined types were placed in this encounter.    Shawnie Dapper, MSN, FNP-C 05/04/2022, 11:22 AM  [facility] [address] [place], [place] [address] [phone]

## 2022-05-02 ENCOUNTER — Other Ambulatory Visit: Payer: Self-pay

## 2022-05-02 MED ORDER — VUMERITY 231 MG PO CPDR: 462.0000 mg | DELAYED_RELEASE_CAPSULE | Freq: Two times a day (BID) | ORAL | 1 refills | Status: DC

## 2022-05-04 ENCOUNTER — Encounter: Payer: Self-pay | Admitting: Family Medicine

## 2022-05-04 ENCOUNTER — Ambulatory Visit (INDEPENDENT_AMBULATORY_CARE_PROVIDER_SITE_OTHER): Payer: Self-pay | Admitting: Family Medicine

## 2022-05-04 ENCOUNTER — Telehealth: Payer: Self-pay | Admitting: Family Medicine

## 2022-05-04 VITALS — BP 122/80 | HR 74 | Ht 65.0 in | Wt 147.6 lb

## 2022-05-04 DIAGNOSIS — Z758 Other problems related to medical facilities and other health care: Secondary | ICD-10-CM

## 2022-05-04 DIAGNOSIS — E785 Hyperlipidemia, unspecified: Secondary | ICD-10-CM

## 2022-05-04 DIAGNOSIS — E559 Vitamin D deficiency, unspecified: Secondary | ICD-10-CM

## 2022-05-04 DIAGNOSIS — G35 Multiple sclerosis: Secondary | ICD-10-CM

## 2022-05-04 MED ORDER — AMPHETAMINE-DEXTROAMPHET ER 15 MG PO CP24: 15.0000 mg | ORAL_CAPSULE | ORAL | 0 refills | Status: DC

## 2022-05-04 NOTE — Telephone Encounter (Signed)
Referral sent to Scherrie Gerlach at [place], phone # [phone].

## 2022-05-04 NOTE — Patient Instructions (Signed)
Below is our plan:  We will continue current treatment plan. I will switch you back to Adderall XR if available. Please consider PT and using an assistive device to prevent falls. I will refer you to PCP.   Please make sure you are staying well hydrated. I recommend 50-60 ounces daily. Well balanced diet and regular exercise encouraged. Consistent sleep schedule with 6-8 hours recommended.   Please continue follow up with care team as directed.   Follow up with Dr Felecia Shelling in 6 months   You may receive a survey regarding today's visit. I encourage you to leave honest feed back as I do use this information to improve patient care. Thank you for seeing me today!

## 2022-05-05 LAB — HEPATIC FUNCTION PANEL
ALT: 22 IU/L (ref 0–32)
AST: 25 IU/L (ref 0–40)
Albumin: 4.8 g/dL (ref 3.8–4.9)
Alkaline Phosphatase: 97 IU/L (ref 44–121)
Bilirubin Total: <0.2 mg/dL (ref 0.0–1.2)
Bilirubin, Direct: <0.1 mg/dL (ref 0.00–0.40)
Total Protein: 6.9 g/dL (ref 6.0–8.5)

## 2022-05-05 LAB — LIPID PANEL
Chol/HDL Ratio: 3.8 ratio (ref 0.0–4.4)
Cholesterol, Total: 290 mg/dL — ABNORMAL HIGH (ref 100–199)
HDL: 77 mg/dL (ref >39)
LDL Chol Calc (NIH): 204 mg/dL — ABNORMAL HIGH (ref 0–99)
Triglycerides: 63 mg/dL (ref 0–149)
VLDL Cholesterol Cal: 9 mg/dL (ref 5–40)

## 2022-05-05 LAB — CBC WITH DIFFERENTIAL/PLATELET
Basophils Absolute: 0.1 10*3/uL (ref 0.0–0.2)
Basos: 1 %
EOS (ABSOLUTE): 0.2 10*3/uL (ref 0.0–0.4)
Eos: 2 %
Hematocrit: 38.2 % (ref 34.0–46.6)
Hemoglobin: 12.7 g/dL (ref 11.1–15.9)
Immature Grans (Abs): 0 10*3/uL (ref 0.0–0.1)
Immature Granulocytes: 0 %
Lymphocytes Absolute: 2.1 10*3/uL (ref 0.7–3.1)
Lymphs: 31 %
MCH: 29.3 pg (ref 26.6–33.0)
MCHC: 33.2 g/dL (ref 31.5–35.7)
MCV: 88 fL (ref 79–97)
Monocytes Absolute: 0.7 10*3/uL (ref 0.1–0.9)
Monocytes: 10 %
Neutrophils Absolute: 3.8 10*3/uL (ref 1.4–7.0)
Neutrophils: 56 %
Platelets: 418 10*3/uL (ref 150–450)
RBC: 4.33 x10E6/uL (ref 3.77–5.28)
RDW: 13.5 % (ref 11.7–15.4)
WBC: 6.8 10*3/uL (ref 3.4–10.8)

## 2022-05-05 LAB — VITAMIN D 25 HYDROXY (VIT D DEFICIENCY, FRACTURES): Vit D, 25-Hydroxy: 28 ng/mL — ABNORMAL LOW (ref 30.0–100.0)

## 2022-05-08 ENCOUNTER — Telehealth: Payer: Self-pay | Admitting: Neurology

## 2022-05-08 ENCOUNTER — Telehealth: Payer: Self-pay | Admitting: Family Medicine

## 2022-05-08 DIAGNOSIS — G35 Multiple sclerosis: Secondary | ICD-10-CM

## 2022-05-08 MED ORDER — AMPHETAMINE-DEXTROAMPHETAMINE 10 MG PO TABS: 10.0000 mg | ORAL_TABLET | Freq: Every day | ORAL | 0 refills | Status: DC

## 2022-05-08 MED ORDER — VUMERITY 231 MG PO CPDR: 462.0000 mg | DELAYED_RELEASE_CAPSULE | Freq: Two times a day (BID) | ORAL | 11 refills | Status: DC

## 2022-05-08 NOTE — Telephone Encounter (Signed)
E-scribed rx to [facility] as requested.

## 2022-05-08 NOTE — Telephone Encounter (Signed)
Diane Carson is calling from [facility]. Stated pt needs a need prescription on medication Diroximel Fumarate (VUMERITY) 231 MG CPD. Prescription should be sent to [facility] [phone] P [phone].

## 2022-05-08 NOTE — Telephone Encounter (Signed)
Called Diane Carson. Cx any rx's on file for Adderall XR 15mg . Confirmed they received adderall 10mg . They will process. I called pt and relayed info. She verbalized understanding and appreciation.

## 2022-05-08 NOTE — Telephone Encounter (Signed)
Diane Carson, are you ok with changing to IR? If so what dose, directions?

## 2022-05-08 NOTE — Telephone Encounter (Signed)
Pt is calling. Stated she needs a refill on medication amphetamine-dextroamphetamine (ADDERALL XR) 15 MG 24 hr capsule. Pt said her pharmacy doesn't have the extended release and can you please call in the regular. Refill should be sent to [facility] [phone].

## 2022-05-18 ENCOUNTER — Other Ambulatory Visit (HOSPITAL_COMMUNITY): Payer: Self-pay

## 2022-06-06 ENCOUNTER — Other Ambulatory Visit: Payer: Self-pay | Admitting: Family Medicine

## 2022-06-06 MED ORDER — AMPHETAMINE-DEXTROAMPHETAMINE 10 MG PO TABS: 10.0000 mg | ORAL_TABLET | Freq: Every day | ORAL | 0 refills | Status: DC

## 2022-06-06 NOTE — Telephone Encounter (Signed)
Pt last seen 05/04/22 and next f/u 11/08/22. Per drug registry, last refilled 05/08/22 #60.

## 2022-06-06 NOTE — Telephone Encounter (Signed)
Pt is calling. Requesting refill on medication  amphetamine-dextroamphetamine (ADDERALL) 10 MG tablet . Refill should be sent to [facility] [id number]

## 2022-06-13 ENCOUNTER — Telehealth: Payer: Self-pay | Admitting: Family Medicine

## 2022-06-13 MED ORDER — AMPHETAMINE-DEXTROAMPHETAMINE 10 MG PO TABS: 10.0000 mg | ORAL_TABLET | Freq: Two times a day (BID) | ORAL | 0 refills | Status: DC

## 2022-06-13 NOTE — Telephone Encounter (Signed)
  Per Drug registry, last refilled 05/08/22 #60.  Last seen 05/04/22 and next f/u 11/02/22.

## 2022-06-13 NOTE — Telephone Encounter (Signed)
Pt is calling. Stated she is taking medication  amphetamine-dextroamphetamine (ADDERALL) 10 MG tablet two twice a day. Stated she has ran out and need more called in to the pharmacy.

## 2022-07-11 ENCOUNTER — Other Ambulatory Visit: Payer: Self-pay | Admitting: Family Medicine

## 2022-07-11 MED ORDER — AMPHETAMINE-DEXTROAMPHETAMINE 10 MG PO TABS: 10.0000 mg | ORAL_TABLET | Freq: Two times a day (BID) | ORAL | 0 refills | Status: DC

## 2022-07-11 NOTE — Telephone Encounter (Signed)
Patient is due for a refill on adderall. Patient is up to date on her appointments. [place] Controlled Substance Registry checked and is appropriate.  

## 2022-07-11 NOTE — Telephone Encounter (Signed)
Pt is needing her amphetamine-dextroamphetamine (ADDERALL) 10 MG tablet refill request sent to the [facility] [address]

## 2022-08-10 ENCOUNTER — Other Ambulatory Visit: Payer: Self-pay | Admitting: Family Medicine

## 2022-08-10 MED ORDER — AMPHETAMINE-DEXTROAMPHETAMINE 10 MG PO TABS: 10.0000 mg | ORAL_TABLET | Freq: Two times a day (BID) | ORAL | 0 refills | Status: DC

## 2022-08-10 NOTE — Telephone Encounter (Signed)
Pt is requesting a refill for amphetamine-dextroamphetamine (ADDERALL XR) 15 MG 24 hr capsule .  Pharmacy: [facility] [id number]

## 2022-08-10 NOTE — Telephone Encounter (Signed)
Last seen 05/04/22 and next f/u 11/08/22. Per drug registry, last refilled 07/11/22 #60.

## 2022-09-12 ENCOUNTER — Other Ambulatory Visit: Payer: Self-pay | Admitting: Family Medicine

## 2022-09-12 MED ORDER — AMPHETAMINE-DEXTROAMPHETAMINE 10 MG PO TABS: 10.0000 mg | ORAL_TABLET | Freq: Two times a day (BID) | ORAL | 0 refills | Status: DC

## 2022-09-12 NOTE — Telephone Encounter (Signed)
Pt last seen on 05/04/22 Follow up scheduled on 11/08/22 Rx last filled on 08/10/22 # 60 tablets. Rx pending to be signed.

## 2022-09-12 NOTE — Telephone Encounter (Signed)
Pt is requesting a refill for amphetamine-dextroamphetamine (ADDERALL) 10 MG tablet.  Pharmacy: [facility] [id number]

## 2022-09-14 ENCOUNTER — Telehealth: Payer: Self-pay | Admitting: Family Medicine

## 2022-09-14 MED ORDER — AMPHETAMINE-DEXTROAMPHETAMINE 10 MG PO TABS: 10.0000 mg | ORAL_TABLET | Freq: Two times a day (BID) | ORAL | 0 refills | Status: DC

## 2022-09-14 NOTE — Telephone Encounter (Signed)
Please call pt back to confirm which pharmacy she wants to use.  Specialty pharmacies do not fill adderall, cannot send to [facility]. Would she like it to go to [facility]? Looks like she has been filling at [facility].

## 2022-09-14 NOTE — Telephone Encounter (Signed)
Pt called [facility] to check on refill for  amphetamine-dextroamphetamine (ADDERALL) 10 MG tablet. [facility] informed have not received a refill request. Pt is out of medication.

## 2022-09-14 NOTE — Telephone Encounter (Signed)
Called pt back. She said it's okay to sent Adderall to [facility] [id number]

## 2022-10-23 ENCOUNTER — Telehealth: Payer: Self-pay | Admitting: Family Medicine

## 2022-10-23 ENCOUNTER — Other Ambulatory Visit: Payer: Self-pay

## 2022-10-23 MED ORDER — AMPHETAMINE-DEXTROAMPHETAMINE 10 MG PO TABS: 10.0000 mg | ORAL_TABLET | Freq: Two times a day (BID) | ORAL | 0 refills | Status: DC

## 2022-10-23 NOTE — Telephone Encounter (Signed)
Pt last Seen 05/04/2022 Upcoming Appointment 11/08/2022 Adderall last Filled 09/14/2022

## 2022-10-23 NOTE — Telephone Encounter (Signed)
Called pt and let her know that Amy sent in her Adderall to her [facility].

## 2022-10-23 NOTE — Telephone Encounter (Signed)
Pt is requesting a refill for amphetamine-dextroamphetamine (ADDERALL) 10 MG tablet.  Pharmacy: [facility] [id number]  

## 2022-10-24 ENCOUNTER — Telehealth: Payer: Self-pay | Admitting: Family Medicine

## 2022-10-24 ENCOUNTER — Other Ambulatory Visit: Payer: Self-pay | Admitting: Neurology

## 2022-10-24 NOTE — Telephone Encounter (Addendum)
Last seen on 05/04/22 " She continues tizanidine  TID, celecoxib  BID and gabapentin  BID for dysesthesias and generalized pain. "   Follow up scheduled on 11/08/22      Gabapentin last filled on 07/31/22 #540 tablet/90 day supply ( I called [facility] to confirm)  I called patient and she believes she has enough to last until her visit on 11/08/22.

## 2022-10-24 NOTE — Telephone Encounter (Signed)
Pt reports that she has been told the manufacture states the ADDERALL is not available. Pt asking this be looked into

## 2022-10-24 NOTE — Telephone Encounter (Signed)
Pt is requesting a refill for gabapentin (NEURONTIN) 300 MG capsule .  Pharmacy: [facility] [phone]

## 2022-10-24 NOTE — Telephone Encounter (Signed)
Last seen on 05/04/22 " She continues tizanidine  TID, celecoxib  BID and gabapentin  BID for dysesthesias and generalized pain. "  Follow up scheduled on 11/08/22

## 2022-10-24 NOTE — Telephone Encounter (Signed)
I called Diane Carson and was informed Adderall is currently on back order with no available date.I called patient and asked if she didn't mind calling some of her preferred pharmacy's to see if they have medication in stock and let me know so Dr.Sater can send Rx to the pharmacy that has medication in stock. Pt verbalized she understood and will call back.

## 2022-10-30 ENCOUNTER — Other Ambulatory Visit: Payer: Self-pay

## 2022-10-30 MED ORDER — AMPHETAMINE-DEXTROAMPHETAMINE 10 MG PO TABS: 10.0000 mg | ORAL_TABLET | Freq: Two times a day (BID) | ORAL | 0 refills | Status: DC

## 2022-10-30 NOTE — Telephone Encounter (Signed)
Amy approved Rx and sent to pharmacy on 10/30/22

## 2022-10-30 NOTE — Telephone Encounter (Signed)
Pt called stating that [facility] was on back order and she is unable to get her prescription. Sent refill request to Amy to be filled at [facility] at [address] [facility] in [place].

## 2022-10-30 NOTE — Telephone Encounter (Signed)
Pt has called asking RN and provider be advised that she is having difficulty in finding the medication. [facility] [address] in [place] # [phone] is telling her they are unable to disclose to her if they have the  amphetamine-dextroamphetamine (ADDERALL) 10 MG tablet in stock.

## 2022-11-06 ENCOUNTER — Other Ambulatory Visit: Payer: Self-pay | Admitting: Neurology

## 2022-11-07 NOTE — Telephone Encounter (Signed)
Requested Prescriptions   Pending Prescriptions Disp Refills   celecoxib (CELEBREX) 200 MG capsule [Pharmacy Med Name: CELECOXIB 200 MG CAPSULE] 180 capsule 0    Sig: TAKE 1 CAPSULE BY MOUTH 2 TIMES A DAY   gabapentin (NEURONTIN) 300 MG capsule [Pharmacy Med Name: GABAPENTIN 300 MG CAPSULE] 540 capsule 1    Sig: TAKE 3 CAPSULES BY MOUTH TWICE A DAY   Last seen 05/04/22 by [facility] NP, next appt scheduled 11/08/22 w/SATER MD ROUTING TO PROVIDER TO REVIEW FOR FILL Gabapentin Adherence  Dispenses  No dispenses within 180 days of the adherence period (since 11/10/2021)

## 2022-11-08 ENCOUNTER — Ambulatory Visit (INDEPENDENT_AMBULATORY_CARE_PROVIDER_SITE_OTHER): Payer: Self-pay | Admitting: Neurology

## 2022-11-08 ENCOUNTER — Encounter: Payer: Self-pay | Admitting: Neurology

## 2022-11-08 VITALS — BP 127/89 | HR 101 | Ht 65.0 in | Wt 144.0 lb

## 2022-11-08 DIAGNOSIS — R208 Other disturbances of skin sensation: Secondary | ICD-10-CM

## 2022-11-08 DIAGNOSIS — F418 Other specified anxiety disorders: Secondary | ICD-10-CM

## 2022-11-08 DIAGNOSIS — Z79899 Other long term (current) drug therapy: Secondary | ICD-10-CM

## 2022-11-08 DIAGNOSIS — G35 Multiple sclerosis: Secondary | ICD-10-CM

## 2022-11-08 DIAGNOSIS — R5383 Other fatigue: Secondary | ICD-10-CM

## 2022-11-08 DIAGNOSIS — R4189 Other symptoms and signs involving cognitive functions and awareness: Secondary | ICD-10-CM

## 2022-11-08 MED ORDER — AMPHETAMINE-DEXTROAMPHETAMINE 15 MG PO TABS: 15.0000 mg | ORAL_TABLET | Freq: Two times a day (BID) | ORAL | 0 refills | Status: DC

## 2022-11-08 MED ORDER — LAMOTRIGINE 100 MG PO TABS: 100.0000 mg | ORAL_TABLET | Freq: Two times a day (BID) | ORAL | 3 refills | Status: DC

## 2022-11-08 NOTE — Progress Notes (Signed)
[facility]  PATIENT: Diane Carson DOB: Jan 10, 1966  REFERRING DOCTOR OR PCP:  Referred by Dr. Stacy Gardner. Her PCP is Guerry Bruin. SOURCE: Patient, notes from Dr. Anne Hahn, MRI and lab reports, MRI images on PACS.  _________________________________   HISTORICAL  CHIEF COMPLAINT:  Chief Complaint  Patient presents with   Room 10    Pt is here Alone. Pt states that things have been pretty good. Pt states that when she is talking to people at work she feels like she is there but not there mentally. Pt states that she has brain fog. Pt states that she has blurry vision. Pt states that she has fatigue throughout the day. Pt states that she is under a lot of stress.     HISTORY OF PRESENT ILLNESS:   Diane Carson is a 57 y.o. 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  Update 11/08/2022: Her MS is stable and no exacerbations or new neurologic symptoms.  She switched to Vumerity when she lost.   No new neurologic symptoms or exacerbations.     Labs 05/2022 showed good lymphocyte count of 2.1.  LFT fine. .  Vit D was mildly low and she was advised to take 5000 U daily.    Her gait is stable.   The left leg has a foot drop, worse with distance/fatigue.  Balance is slightly off and she uses the bannister on stairs.  Gait is worse with longer walks.    Strength is fine.  Spasms are well controlled with tizanidine and she tolerates it well..  The dysesthetic pain in the limbs sciatic type pain on her left are much better with lamotrigine 100 mg daily and gabapentin 300 mg - 600 mg and amitriptyline.      Her vision is slightly blurry but stable.  She feels it is a bit worse when more tired.       Bladder is fine.   She has decreased focus/attention and fatigue.    Adderall has helped cognition and she tolerates it well.   She feels much worse if she skips a day.   Mood is doing well on escitalopram 20 mg and lamotrigine 100 mg po bid.   She has sleep maintenance insomnia doing well on  amitriptyline   No dry mouth or other s.e.        She works Clinical cytogeneticist a Teaching laboratory technician.   Her boss has a terminal disorder and his wife has AD so she is helping them.       MS History:    In 1999, she had left optic neuritis. She did not receive steroids at that time but did have an MRI of the brain that was reportedly normal. She did very well for the next 14 years. In 2013, she woke up with numbness in her feet the one day. Over the next 2 days numbness evolved to be more intense and be in the distribution up to the mid thoracic level.   She also had decreased balance and fell a couple times. She noted that the numbness was near total of the breast. She received several days of IV steroids and symptoms improved, but not completely to baseline.    Between 2013 and 2017,  she has had no definite exacerbation. However, she has noted more difficulty with cognitive function, emotional lability, and fatigue.    MRI of the brain and spine dated 06/07/2016 and compared with the MRI dated 08/28/2011.  The older MRI shows a couple small T2/FLAIR hyperintense foci  in the deep white matter and white matter. On the current study, there are about 6 or 7 more foci in the hemispheres including more in the periventricular white matter and 2 foci in the right cerebellar hemisphere. The 2017 MRI of the cervical spine shows mild degenerative changes (with foraminal narrowing to the left at C7T1) 5but a normal spinal cord. The 2017 MRI of the thoracic spine shows a chronic plaque at T7.  Also, there is a disc protrusion at T9-T10.   The NMO Ab is negative.     IMAGING MRI brain 05/11/2021 showed Scattered T2/FLAIR hyperintense foci in the hemispheres in a pattern consistent with chronic demyelinating plaque associated with multiple sclerosis.  None of the foci enhanced.  Compared to the MRI from 12/25/2018, there are no new lesions.   MRI of the brain July 2020 in [facility] showed one small new right frontal white matter  focus compared to MRI 2018.     MRI cervical spine 2020 showed a very small dot in the spinal cord at C6C7 (left posterolateral), not clearly present in 2017 but also seen on the 2013 MRi --- artifact vs stable tiny focus  REVIEW OF SYSTEMS: Constitutional: No fevers, chills, sweats, or change in appetite.   She has Fatigue Eyes: Notes occ blurred vision.  N0 double vision, eye pain Ear, nose and throat: No hearing loss, ear pain, nasal congestion, sore throat Cardiovascular: No chest pain, palpitations Respiratory:  No shortness of breath at rest or with exertion.   No wheezes GastrointestinaI: No nausea, vomiting, diarrhea, abdominal pain, fecal incontinence Genitourinary:  No dysuria, urinary retention or frequency.  No nocturia. Musculoskeletal:  No neck pain, back pain Integumentary: No rash, pruritus, skin lesions Neurological: as above Psychiatric: Notes some depression and anxiety.  She is going through a divorce,  Endocrine: No palpitations, diaphoresis, change in appetite, change in weigh or increased thirst Hematologic/Lymphatic:  No anemia, purpura, petechiae. Allergic/Immunologic: No itchy/runny eyes, nasal congestion, recent allergic reactions, rashes  ALLERGIES: Allergies  Allergen Reactions   Atorvastatin     Ringing of the ears, itching, heartburn   Codeine Shortness Of Breath    HOME MEDICATIONS:  Current Outpatient Medications:    amitriptyline (ELAVIL) 25 MG tablet, Take 1 tablet (25 mg total) by mouth at bedtime., Disp: 90 tablet, Rfl: 3   calcium carbonate (OSCAL) 1500 (600 Ca) MG TABS tablet, Take 600 mg of elemental calcium by mouth 2 (two) times daily with a meal., Disp: , Rfl:    celecoxib (CELEBREX) 200 MG capsule, TAKE 1 CAPSULE BY MOUTH 2 TIMES A DAY, Disp: 180 capsule, Rfl: 0   cholecalciferol (VITAMIN D) 1000 units tablet, Take 5,000 Units by mouth daily., Disp: , Rfl:    Diroximel Fumarate (VUMERITY) 231 MG CPDR, Take 462 mg by mouth 2 (two) times  daily., Disp: 120 capsule, Rfl: 11   escitalopram (LEXAPRO) 20 MG tablet, Take 1.5 tablets (30 mg total) by mouth daily., Disp: 137 tablet, Rfl: 3   gabapentin (NEURONTIN) 300 MG capsule, TAKE 3 CAPSULES BY MOUTH TWICE A DAY, Disp: 540 capsule, Rfl: 1   Multiple Vitamin (MULTIVITAMIN) tablet, Take 1 tablet by mouth daily., Disp: , Rfl:    tiZANidine (ZANAFLEX) 4 MG tablet, Take 2 tablets (8 mg total) by mouth 3 (three) times daily. TAKE TWO TABLETS BY MOUTH THREE TIMES A DAY, Disp: 180 tablet, Rfl: 11   ALPRAZolam (XANAX) 0.5 MG tablet, Take 1 tablet (0.5 mg total) by mouth at bedtime as needed  for anxiety (Take 1 tablet 30 min prior to the MRI procedure. Take additional tablet if needed at the time of the MRI. (will need a driver)). (Patient not taking: Reported on 11/08/2022), Disp: 2 tablet, Rfl: 0   amphetamine-dextroamphetamine (ADDERALL) 15 MG tablet, Take 1 tablet by mouth 2 (two) times daily., Disp: 60 tablet, Rfl: 0   lamoTRIgine (LAMICTAL) 100 MG tablet, Take 1 tablet (100 mg total) by mouth 2 (two) times daily., Disp: 180 tablet, Rfl: 3 No current facility-administered medications for this visit.  Facility-Administered Medications Ordered in Other Visits:    gadopentetate dimeglumine (MAGNEVIST) injection 13 mL, 13 mL, Intravenous, Once PRN, Kambre Messner, Pearletha Furl, MD  PAST MEDICAL HISTORY: Past Medical History:  Diagnosis Date   Degenerative disc disease, cervical    Multiple sclerosis (HCC)    Osteoarthritis    thumbs   Osteoporosis    Vision abnormalities     PAST SURGICAL HISTORY: Past Surgical History:  Procedure Laterality Date   BREAST BIOPSY Right    Biopsy over 20 years ago, done in [place]   JOINT REPLACEMENT Right 09/07/2016   thumb    FAMILY HISTORY: Family History  Adopted: Yes  Problem Relation Age of Onset   Colon cancer Brother     SOCIAL HISTORY:  Social History   Socioeconomic History   Marital status: Married    Spouse name: Not on file   Number  of children: Not on file   Years of education: Not on file   Highest education level: Not on file  Occupational History   Not on file  Tobacco Use   Smoking status: Former   Smokeless tobacco: Never  Substance and Sexual Activity   Alcohol use: No   Drug use: Yes    Types: Marijuana    Comment: daily marijuana use   Sexual activity: Not on file  Other Topics Concern   Not on file  Social History Narrative   Not on file   Social Determinants of Health   Financial Resource Strain: Not on file  Food Insecurity: Not on file  Transportation Needs: Not on file  Physical Activity: Not on file  Stress: Not on file  Social Connections: Not on file  Intimate Partner Violence: Not on file     PHYSICAL EXAM  Vitals:   11/08/22 1115  BP: 127/89  Pulse: (!) 101  Weight: 144 lb (65.3 kg)  Height: 5\' 5"  (1.651 m)     Body mass index is 23.96 kg/m.   General: The patient is well-developed and well-nourished and in no acute distress    Neurologic Exam  Mental status: The patient is alert and oriented x 3 at the time of the examination. The patient has apparent normal recent and remote memory, with an apparently normal attention span and concentration ability.   Speech is normal.  Cranial nerves: Extraocular movements are full. Facial strength and sensation are normal.  Trapezius and sternocleidomastoid strength is normal. No dysarthria is noted.   Hearing is normal.  Motor:  Muscle bulk is normal.   Tone is normal. Strength is  5 / 5 in all 4 extremities except 4+/5 left  foot  Sensory: Symmetric touch and vibration sensation.  Coordination: Cerebellar testing reveals good finger-nose-finger and heel-to-shin bilaterally  Gait and station: Station is normal.   She a mild left foot drop.    Her tandem gait is poor .  Romberg is negative.   Reflexes: Deep tendon reflexes are symmetric and normal in arms.  She had increased deep tendon reflexes in the legs and there was  spread at the knee, left > rigjt.  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   She will continue Vumerity.  We will check a CBC with differential today.  Check MRI of the brain later this year or early next year to determine if there is subclinical progression. 2.   Continue medications for MS symptoms and mood.    3.   Continue Adderall for ADD/cognitive changes related to MS.  Continue lamotrigine 100 mg po bid for dysesthesias 4.   F/u in 6 months, sooner if problems   Dhani Dannemiller A. Epimenio Foot, MD, PhD 11/08/2022, 11:32 AM Certified in Neurology, Clinical Neurophysiology, Sleep Medicine, Pain Medicine and Neuroimaging  [facility] [address] [place], [place] [address] [phone]

## 2022-11-09 LAB — CBC WITH DIFFERENTIAL/PLATELET
Basophils Absolute: 0.1 10*3/uL (ref 0.0–0.2)
Basos: 1 %
EOS (ABSOLUTE): 0.1 10*3/uL (ref 0.0–0.4)
Eos: 2 %
Hematocrit: 38.5 % (ref 34.0–46.6)
Hemoglobin: 12.8 g/dL (ref 11.1–15.9)
Immature Grans (Abs): 0 10*3/uL (ref 0.0–0.1)
Immature Granulocytes: 0 %
Lymphocytes Absolute: 2.3 10*3/uL (ref 0.7–3.1)
Lymphs: 33 %
MCH: 29.3 pg (ref 26.6–33.0)
MCHC: 33.2 g/dL (ref 31.5–35.7)
MCV: 88 fL (ref 79–97)
Monocytes Absolute: 0.7 10*3/uL (ref 0.1–0.9)
Monocytes: 9 %
Neutrophils Absolute: 3.8 10*3/uL (ref 1.4–7.0)
Neutrophils: 55 %
Platelets: 413 10*3/uL (ref 150–450)
RBC: 4.37 x10E6/uL (ref 3.77–5.28)
RDW: 13.7 % (ref 11.7–15.4)
WBC: 6.9 10*3/uL (ref 3.4–10.8)

## 2022-12-08 ENCOUNTER — Other Ambulatory Visit: Payer: Self-pay | Admitting: Neurology

## 2022-12-12 ENCOUNTER — Other Ambulatory Visit: Payer: Self-pay | Admitting: Neurology

## 2022-12-12 MED ORDER — AMPHETAMINE-DEXTROAMPHETAMINE 15 MG PO TABS: 15.0000 mg | ORAL_TABLET | Freq: Two times a day (BID) | ORAL | 0 refills | Status: DC

## 2022-12-12 NOTE — Telephone Encounter (Signed)
Pt is needing a refill request for her amphetamine-dextroamphetamine (ADDERALL) 15 MG tablet sent in to the [facility] on [address]

## 2022-12-12 NOTE — Telephone Encounter (Signed)
Pt last seen on 11/08/22 Follow up scheduled on 05/22/23 Last filled on 11/08/22 #60 tablets (30 day supply) Rx pending to be signed

## 2022-12-18 ENCOUNTER — Other Ambulatory Visit: Payer: Self-pay | Admitting: Neurology

## 2022-12-18 ENCOUNTER — Telehealth: Payer: Self-pay | Admitting: Neurology

## 2022-12-18 NOTE — Telephone Encounter (Signed)
Pt is asking for advise re: numbness in ring finger on right hand and 4 week neck pain.  Pt has been to ED was told it is Arthritis in neck.  Pt asking if she should go to an orthopedic, please call pt.

## 2022-12-18 NOTE — Telephone Encounter (Addendum)
Called and spoke with patient regarding the below. Pt said about 4 weeks ago she noticed neck pain that hurts all the time, no recent injury to neck. She reports her left ring finger and pinky are slightly numb, she still can feel in both fingers this started on over the weekend. She states neck pain is worse at night takes tylenol. She went to [facility] in [place] and was prescribed 5 day dose of steroid finished last dose today, states steroids helped some, but not  100%. She is scheduled to see orthopedic on 12/28/22. Pt reports she is still taking all MS related medications Vumerity, Adderall, lamotrigine. Pt reports no recent infections.     I spoke with Dr.Sater and also recommended that patient follow up with orthopedic based on symptoms. Pt verbalized she understood and follow up.

## 2023-01-11 ENCOUNTER — Other Ambulatory Visit: Payer: Self-pay | Admitting: Neurology

## 2023-01-11 MED ORDER — AMPHETAMINE-DEXTROAMPHETAMINE 15 MG PO TABS: 15.0000 mg | ORAL_TABLET | Freq: Two times a day (BID) | ORAL | 0 refills | Status: DC

## 2023-01-11 NOTE — Telephone Encounter (Signed)
Dr.Camara you are work in this pm, Dr.Sater patient  Last seen on 11/08/22 Follow up scheduled on 05/22/23 Last filled on 12/12/22 #60 tablets (30 day supply) Rx pending to be signed

## 2023-01-11 NOTE — Telephone Encounter (Signed)
Pt is requesting a refill for amphetamine-dextroamphetamine (ADDERALL) 15 MG tablet.  Pharmacy: [facility] [id number]

## 2023-02-03 ENCOUNTER — Other Ambulatory Visit: Payer: Self-pay | Admitting: Neurology

## 2023-02-05 NOTE — Telephone Encounter (Signed)
Last seen on 11/08/22 Follow up scheduled on 05/22/23

## 2023-02-09 ENCOUNTER — Other Ambulatory Visit: Payer: Self-pay | Admitting: Neurology

## 2023-02-09 NOTE — Telephone Encounter (Signed)
Pt is requesting a refill for amphetamine-dextroamphetamine (ADDERALL) 15 MG tablet.  Pharmacy: [facility] [id number]

## 2023-02-12 MED ORDER — AMPHETAMINE-DEXTROAMPHETAMINE 15 MG PO TABS: 15.0000 mg | ORAL_TABLET | Freq: Two times a day (BID) | ORAL | 0 refills | Status: DC

## 2023-02-12 NOTE — Telephone Encounter (Signed)
Patient last seen on 11/08/22 Follow up scheduled on 05/22/23 Last filled on 01/11/23 #60 tablets, (30 day supply) Rx pending to be signed

## 2023-03-21 ENCOUNTER — Other Ambulatory Visit: Payer: Self-pay | Admitting: Neurology

## 2023-03-21 NOTE — Telephone Encounter (Signed)
Pt request refill for amphetamine-dextroamphetamine (ADDERALL) 15 MG tablet  sent to [facility] [phone]

## 2023-03-21 NOTE — Telephone Encounter (Signed)
Last filled 02-12-2023 #60.  Last seen 11-08-2022, next appt 05-22-2023.

## 2023-03-22 MED ORDER — AMPHETAMINE-DEXTROAMPHETAMINE 15 MG PO TABS: 15.0000 mg | ORAL_TABLET | Freq: Two times a day (BID) | ORAL | 0 refills | Status: DC

## 2023-04-24 ENCOUNTER — Other Ambulatory Visit: Payer: Self-pay | Admitting: Neurology

## 2023-04-24 MED ORDER — AMPHETAMINE-DEXTROAMPHETAMINE 15 MG PO TABS: 15.0000 mg | ORAL_TABLET | Freq: Two times a day (BID) | ORAL | 0 refills | Status: DC

## 2023-04-24 NOTE — Telephone Encounter (Signed)
Pt is requesting a refill for amphetamine-dextroamphetamine (ADDERALL) 15 MG tablet.  Pharmacy: [facility]

## 2023-04-24 NOTE — Telephone Encounter (Signed)
Dr. Epimenio Foot- please e-scribe  Last seen 11/08/22 and next f/u 05/22/23. Last refilled 03/22/23 #60.

## 2023-05-04 ENCOUNTER — Other Ambulatory Visit: Payer: Self-pay | Admitting: Neurology

## 2023-05-07 NOTE — Telephone Encounter (Signed)
Last seen on 11/08/22 per note " The dysesthetic pain in the limbs sciatic type pain on her left are much better with lamotrigine 100 mg daily and gabapentin 300 mg - 600 mg and amitriptyline.   Follow up scheduled on 05/22/23

## 2023-05-09 ENCOUNTER — Telehealth: Payer: Self-pay | Admitting: Neurology

## 2023-05-09 DIAGNOSIS — G35 Multiple sclerosis: Secondary | ICD-10-CM

## 2023-05-09 MED ORDER — VUMERITY 231 MG PO CPDR
462.0000 mg | DELAYED_RELEASE_CAPSULE | Freq: Two times a day (BID) | ORAL | 5 refills | Status: DC
Start: 2023-05-09 — End: 2023-10-22

## 2023-05-09 NOTE — Telephone Encounter (Signed)
[facility] request refill for Diroximel Fumarate (VUMERITY) 231 MG CPDR

## 2023-05-09 NOTE — Telephone Encounter (Signed)
E-scribed refill 

## 2023-05-17 NOTE — Progress Notes (Signed)
Chief Complaint  Patient presents with   Room 1    Pt is here Alone. Pt states that things have been pretty good. Pt states that she had an episode where her face went numb that lasted 2 days.      HISTORY OF PRESENT ILLNESS:  05/22/23 ALL:  Diane Carson is a 57 y.o. female here today for follow up for RRMS. She continues Vumerity. Labs stable 11/2022. Last imaging 05/2021 stable.    She feels that she is doing fairly well. No new or exacerbating symptoms. She feels gait is stable. No falls. She is not using an assistive device. She did have an event about a month ago where she reports the right side of her face was numb for about 2 days. Symptoms spontaneously resolved.   She continues tizanidine 8mg  TID, celecoxib 200mg  BID and gabapentin 900mg  BID for dysesthesias and generalized pain. Tizanidine helps left leg cramps, worse at night. She is trying to do better with water intake. She drinks at least 32-40 ounces daily.   She does continues to note fatigue. She continues Adderall 15mg  IR BID. She feels that XR dosing worked better for attention deficit and cognitive fog but was having trouble getting it..   Lamotrigine 100mg  BID and escitalopram 30mg  daily helps significantly with mood. She feels she is doing very well from mood perspective.   She sleeps well. Amitriptyline 25mg  QHS helps with insomnia. She feels that she can not sleep without taking.   She takes vitamin D 5,000iu daily.   HISTORY (copied from Dr Bonnita Hollow previous note)   Diane Carson is a 57 y.o. 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   Update 11/08/2022: Her MS is stable and no exacerbations or new neurologic symptoms.  She switched to Vumerity when she lost.   No new neurologic symptoms or exacerbations.      Labs 05/2022 showed good lymphocyte count of 2.1.  LFT fine. .  Vit D was mildly low and she was advised to take 5000 U daily.     Her gait is stable.   The left leg has a foot drop, worse with distance/fatigue.  Balance  is slightly off and she uses the bannister on stairs.  Gait is worse with longer walks.    Strength is fine.  Spasms are well controlled with tizanidine and she tolerates it well..  The dysesthetic pain in the limbs sciatic type pain on her left are much better with lamotrigine 100 mg daily and gabapentin 300 mg - 600 mg and amitriptyline.       Her vision is slightly blurry but stable.  She feels it is a bit worse when more tired.        Bladder is fine.    She has decreased focus/attention and fatigue.    Adderall has helped cognition and she tolerates it well.   She feels much worse if she skips a day.   Mood is doing well on escitalopram 20 mg and lamotrigine 100 mg po bid.   She has sleep maintenance insomnia doing well on amitriptyline   No dry mouth or other s.e.         She works Clinical cytogeneticist a Teaching laboratory technician.   Her boss has a terminal disorder and his wife has AD so she is helping them.          MS History:    In 1999, she had left optic neuritis. She did not receive steroids at that time but did have  an MRI of the brain that was reportedly normal. She did very well for the next 14 years. In 2013, she woke up with numbness in her feet the one day. Over the next 2 days numbness evolved to be more intense and be in the distribution up to the mid thoracic level.   She also had decreased balance and fell a couple times. She noted that the numbness was near total of the breast. She received several days of IV steroids and symptoms improved, but not completely to baseline.    Between 2013 and 2017,  she has had no definite exacerbation. However, she has noted more difficulty with cognitive function, emotional lability, and fatigue.    MRI of the brain and spine dated 06/07/2016 and compared with the MRI dated 08/28/2011.  The older MRI shows a couple small T2/FLAIR hyperintense foci in the deep white matter and white matter. On the current study, there are about 6 or 7 more foci in the hemispheres  including more in the periventricular white matter and 2 foci in the right cerebellar hemisphere. The 2017 MRI of the cervical spine shows mild degenerative changes (with foraminal narrowing to the left at C7T1) 5but a normal spinal cord. The 2017 MRI of the thoracic spine shows a chronic plaque at T7.  Also, there is a disc protrusion at T9-T10.   The NMO Ab is negative.      IMAGING MRI brain 05/11/2021 showed Scattered T2/FLAIR hyperintense foci in the hemispheres in a pattern consistent with chronic demyelinating plaque associated with multiple sclerosis.  None of the foci enhanced.  Compared to the MRI from 12/25/2018, there are no new lesions.   MRI of the brain July 2020 in [facility] showed one small new right frontal white matter focus compared to MRI 2018.      MRI cervical spine 2020 showed a very small dot in the spinal cord at C6C7 (left posterolateral), not clearly present in 2017 but also seen on the 2013 MRi --- artifact vs stable tiny focus   REVIEW OF SYSTEMS: Out of a complete 14 system review of symptoms, the patient complains only of the following symptoms, dysesthesias, insomnia, anxiety, depression, brain fog, inattention, cramps, and all other reviewed systems are negative.   ALLERGIES: Allergies  Allergen Reactions   Atorvastatin     Ringing of the ears, itching, heartburn   Codeine Shortness Of Breath     HOME MEDICATIONS: Outpatient Medications Prior to Visit  Medication Sig Dispense Refill   amitriptyline (ELAVIL) 25 MG tablet TAKE 1 TABLET BY MOUTH AT BEDTIME 90 tablet 3   calcium carbonate (OSCAL) 1500 (600 Ca) MG TABS tablet Take 600 mg of elemental calcium by mouth 2 (two) times daily with a meal.     cholecalciferol (VITAMIN D) 1000 units tablet Take 5,000 Units by mouth daily.     Diroximel Fumarate (VUMERITY) 231 MG CPDR Take 462 mg by mouth 2 (two) times daily. 120 capsule 5   gabapentin (NEURONTIN) 300 MG capsule TAKE 3 CAPSULES BY MOUTH TWICE A DAY 540  capsule 0   lamoTRIgine (LAMICTAL) 100 MG tablet Take 1 tablet (100 mg total) by mouth 2 (two) times daily. 180 tablet 3   Multiple Vitamin (MULTIVITAMIN) tablet Take 1 tablet by mouth daily.     tiZANidine (ZANAFLEX) 4 MG tablet TAKE 2 TABLETS BY MOUTH 3 TIMES DAILY 180 tablet 11   amphetamine-dextroamphetamine (ADDERALL) 15 MG tablet Take 1 tablet by mouth 2 (two) times daily. 60  tablet 0   celecoxib (CELEBREX) 200 MG capsule TAKE 1 CAPSULE BY MOUTH 2 TIMES A DAY 180 capsule 1   escitalopram (LEXAPRO) 20 MG tablet TAKE 1 AND 1/2 TABLETS (30 MG TOTAL) BY MOUTH DAILY 135 tablet 1   ALPRAZolam (XANAX) 0.5 MG tablet Take 1 tablet (0.5 mg total) by mouth at bedtime as needed for anxiety (Take 1 tablet 30 min prior to the MRI procedure. Take additional tablet if needed at the time of the MRI. (will need a driver)). (Patient not taking: Reported on 05/22/2023) 2 tablet 0   gadopentetate dimeglumine (MAGNEVIST) injection 13 mL      No facility-administered medications prior to visit.     PAST MEDICAL HISTORY: Past Medical History:  Diagnosis Date   Degenerative disc disease, cervical    Multiple sclerosis (HCC)    Osteoarthritis    thumbs   Osteoporosis    Vision abnormalities      PAST SURGICAL HISTORY: Past Surgical History:  Procedure Laterality Date   BREAST BIOPSY Right    Biopsy over 20 years ago, done in [place]   JOINT REPLACEMENT Right 09/07/2016   thumb     FAMILY HISTORY: Family History  Adopted: Yes  Problem Relation Age of Onset   Colon cancer Brother      SOCIAL HISTORY: Social History   Socioeconomic History   Marital status: Married    Spouse name: Not on file   Number of children: Not on file   Years of education: Not on file   Highest education level: Not on file  Occupational History   Not on file  Tobacco Use   Smoking status: Former   Smokeless tobacco: Never  Substance and Sexual Activity   Alcohol use: No   Drug use: Yes    Types:  Marijuana    Comment: daily marijuana use   Sexual activity: Not on file  Other Topics Concern   Not on file  Social History Narrative   Not on file   Social Determinants of Health   Financial Resource Strain: Not on file  Food Insecurity: Not on file  Transportation Needs: Not on file  Physical Activity: Not on file  Stress: Not on file  Social Connections: Unknown (11/15/2021)   Received from [facility]   Social Network    Social Network: Not on file  Intimate Partner Violence: Unknown (10/07/2021)   [facility] from [facility]   HITS    Physically Hurt: Not on file    Insult or Talk Down To: Not on file    Threaten Physical Harm: Not on file    Scream or Curse: Not on file     PHYSICAL EXAM  Vitals:   05/22/23 0957  BP: 120/87  Pulse: (!) 56  Weight: 145 lb (65.8 kg)  Height: 5\' 5"  (1.651 m)     Body mass index is 24.13 kg/m.  Generalized: Well developed, in no acute distress  Cardiology: normal rate and rhythm, no murmur auscultated  Respiratory: clear to auscultation bilaterally    Neurological examination  Mentation: Alert oriented to time, place, history taking. Follows all commands speech and language fluent Cranial nerve II-XII: Pupils were equal round reactive to light. Extraocular movements were full, visual field were full on confrontational test. Facial sensation and strength were normal. Uvula tongue midline. Head turning and shoulder shrug  were normal and symmetric. Motor: The motor testing reveals 5 over 5 strength of all 4 extremities with exception of  4+/5 left hip flexion. Good symmetric motor tone is noted throughout.  Sensory: Sensory testing is intact to soft touch on all 4 extremities. No evidence of extinction is noted.  Coordination: Cerebellar testing reveals good finger-nose-finger and heel-to-shin bilaterally.  Gait and station: Gait is normal.    DIAGNOSTIC DATA (LABS, IMAGING, TESTING) - I reviewed  patient records, labs, notes, testing and imaging myself where available.  Lab Results  Component Value Date   WBC 6.9 11/08/2022   HGB 12.8 11/08/2022   HCT 38.5 11/08/2022   MCV 88 11/08/2022   PLT 413 11/08/2022      Component Value Date/Time   NA 141 05/02/2021 1028   K 4.6 05/02/2021 1028   CL 103 05/02/2021 1028   CO2 25 05/02/2021 1028   GLUCOSE 82 05/02/2021 1028   BUN 13 05/02/2021 1028   CREATININE 0.85 05/02/2021 1028   CALCIUM 9.5 05/02/2021 1028   PROT 6.9 05/04/2022 1128   ALBUMIN 4.8 05/04/2022 1128   AST 25 05/04/2022 1128   ALT 22 05/04/2022 1128   ALKPHOS 97 05/04/2022 1128   BILITOT <0.2 05/04/2022 1128   GFRNONAA 90 04/20/2017 1018   GFRAA 103 04/20/2017 1018   Lab Results  Component Value Date   CHOL 290 (H) 05/04/2022   HDL 77 05/04/2022   LDLCALC 204 (H) 05/04/2022   TRIG 63 05/04/2022   CHOLHDL 3.8 05/04/2022   No results found for: "HGBA1C" No results found for: "VITAMINB12" No results found for: "TSH"      No data to display               No data to display           ASSESSMENT AND PLAN  57 y.o. year old female  has a past medical history of Degenerative disc disease, cervical, Multiple sclerosis (HCC), Osteoarthritis, Osteoporosis, and Vision abnormalities. 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 She will continue Vumerity as prescribed. We will update labs, today. Will update MRI brain. She will continue tizanidine, celecoxib, gabapentin, Adderall, lamotrigine, escitalopram and amitriptyline as prescribed. PDMP appropriate. She will continue healthy lifestyle habits. Follow up with Dr Epimenio Foot in 6 months.    Orders Placed  This Encounter  Procedures   MR BRAIN W WO CONTRAST    Uninsured, needs cheapest option for cash pay    Standing Status:   Future    Standing Expiration Date:   05/16/2024    Order Specific Question:   If indicated for the ordered procedure, I authorize the administration of contrast media per Radiology protocol    Answer:   Yes    Order Specific Question:   What is the patient's sedation requirement?    Answer:   No Sedation    Order Specific Question:   Does the patient have a pacemaker or implanted devices?    Answer:   No    Order Specific Question:   Radiology Contrast Protocol - do NOT remove file path    Answer:   [email or web address]    Order Specific Question:   Preferred imaging location?    Answer:   Internal   CBC with Differential/Platelets   Vitamin D, 25-hydroxy  Meds ordered this encounter  Medications   escitalopram (LEXAPRO) 20 MG tablet    Sig: Take 1.5 tablets (30 mg total) by mouth daily.    Dispense:  135 tablet    Refill:  1    Order Specific Question:   Supervising Provider    Answer:   Anson Fret [id number]   celecoxib (CELEBREX) 200 MG capsule    Sig: Take 1 capsule (200 mg total) by mouth 2 (two) times daily.    Dispense:  180 capsule    Refill:  1    Order Specific Question:   Supervising Provider    Answer:   Anson Fret [[id number]]   amphetamine-dextroamphetamine (ADDERALL) 15 MG tablet    Sig: Take 1 tablet by mouth 2 (two) times daily.    Dispense:  60 tablet    Refill:  0    Order Specific Question:   Supervising Provider    Answer:   Anson Fret [[id number]]     WNU UVOZD, MSN, FNP-C 05/22/2023, 10:51 AM  [facility] [address] [place], [place] [address] [phone]

## 2023-05-17 NOTE — Patient Instructions (Signed)
Below is our plan:  We will continue current treatment plan. We will update labs, today. We will place orders for an updated MRI.   Please make sure you are staying well hydrated. I recommend 50-60 ounces daily. Well balanced diet and regular exercise encouraged. Consistent sleep schedule with 6-8 hours recommended.   Please continue follow up with care team as directed.   Follow up with Dr Epimenio Foot in 6 months   You may receive a survey regarding today's visit. I encourage you to leave honest feed back as I do use this information to improve patient care. Thank you for seeing me today!

## 2023-05-22 ENCOUNTER — Ambulatory Visit (INDEPENDENT_AMBULATORY_CARE_PROVIDER_SITE_OTHER): Payer: Self-pay | Admitting: Family Medicine

## 2023-05-22 ENCOUNTER — Encounter: Payer: Self-pay | Admitting: Family Medicine

## 2023-05-22 VITALS — BP 120/87 | HR 56 | Ht 65.0 in | Wt 145.0 lb

## 2023-05-22 DIAGNOSIS — G35 Multiple sclerosis: Secondary | ICD-10-CM

## 2023-05-22 DIAGNOSIS — Z79899 Other long term (current) drug therapy: Secondary | ICD-10-CM

## 2023-05-22 DIAGNOSIS — F418 Other specified anxiety disorders: Secondary | ICD-10-CM

## 2023-05-22 DIAGNOSIS — R208 Other disturbances of skin sensation: Secondary | ICD-10-CM

## 2023-05-22 DIAGNOSIS — R5383 Other fatigue: Secondary | ICD-10-CM

## 2023-05-22 DIAGNOSIS — R4189 Other symptoms and signs involving cognitive functions and awareness: Secondary | ICD-10-CM

## 2023-05-22 DIAGNOSIS — E559 Vitamin D deficiency, unspecified: Secondary | ICD-10-CM

## 2023-05-22 MED ORDER — CELECOXIB 200 MG PO CAPS: 200.0000 mg | ORAL_CAPSULE | Freq: Two times a day (BID) | ORAL | 1 refills | Status: DC

## 2023-05-22 MED ORDER — ESCITALOPRAM OXALATE 20 MG PO TABS: 30.0000 mg | ORAL_TABLET | Freq: Every day | ORAL | 1 refills | Status: DC

## 2023-05-22 MED ORDER — AMPHETAMINE-DEXTROAMPHETAMINE 15 MG PO TABS: 15.0000 mg | ORAL_TABLET | Freq: Two times a day (BID) | ORAL | 0 refills | Status: DC

## 2023-05-23 LAB — CBC WITH DIFFERENTIAL/PLATELET
Basophils Absolute: 0.1 10*3/uL (ref 0.0–0.2)
Basos: 1 %
EOS (ABSOLUTE): 0.1 10*3/uL (ref 0.0–0.4)
Eos: 2 %
Hematocrit: 39.2 % (ref 34.0–46.6)
Hemoglobin: 12.8 g/dL (ref 11.1–15.9)
Immature Grans (Abs): 0 10*3/uL (ref 0.0–0.1)
Immature Granulocytes: 0 %
Lymphocytes Absolute: 2.4 10*3/uL (ref 0.7–3.1)
Lymphs: 32 %
MCH: 29.7 pg (ref 26.6–33.0)
MCHC: 32.7 g/dL (ref 31.5–35.7)
MCV: 91 fL (ref 79–97)
Monocytes Absolute: 0.8 10*3/uL (ref 0.1–0.9)
Monocytes: 10 %
Neutrophils Absolute: 4.1 10*3/uL (ref 1.4–7.0)
Neutrophils: 55 %
Platelets: 471 10*3/uL — ABNORMAL HIGH (ref 150–450)
RBC: 4.31 x10E6/uL (ref 3.77–5.28)
RDW: 13.1 % (ref 11.7–15.4)
WBC: 7.4 10*3/uL (ref 3.4–10.8)

## 2023-05-23 LAB — VITAMIN D 25 HYDROXY (VIT D DEFICIENCY, FRACTURES): Vit D, 25-Hydroxy: 77.6 ng/mL (ref 30.0–100.0)

## 2023-06-06 ENCOUNTER — Ambulatory Visit (INDEPENDENT_AMBULATORY_CARE_PROVIDER_SITE_OTHER): Payer: Self-pay

## 2023-06-06 DIAGNOSIS — G35 Multiple sclerosis: Secondary | ICD-10-CM

## 2023-06-06 MED ORDER — GADOBENATE DIMEGLUMINE 529 MG/ML IV SOLN
15.0000 mL | Freq: Once | INTRAVENOUS | Status: AC | PRN
  Administered 2023-06-06: 15 mL via INTRAVENOUS

## 2023-07-17 ENCOUNTER — Other Ambulatory Visit: Payer: Self-pay | Admitting: Family Medicine

## 2023-07-17 MED ORDER — AMPHETAMINE-DEXTROAMPHETAMINE 15 MG PO TABS: 15.0000 mg | ORAL_TABLET | Freq: Two times a day (BID) | ORAL | 0 refills | Status: DC

## 2023-07-17 NOTE — Telephone Encounter (Signed)
Pt is requesting a refill for amphetamine-dextroamphetamine (ADDERALL) 15 MG tablet.  Pharmacy: [facility]

## 2023-07-17 NOTE — Telephone Encounter (Signed)
 Last seen 05/22/23 and next f/u 12/06/23. Last refilled 05/22/23 #60.

## 2023-07-17 NOTE — Addendum Note (Signed)
 Addended by: Arther Abbott on: 07/17/2023 02:38 PM   Modules accepted: Orders

## 2023-08-02 ENCOUNTER — Other Ambulatory Visit: Payer: Self-pay | Admitting: Neurology

## 2023-09-17 ENCOUNTER — Other Ambulatory Visit: Payer: Self-pay | Admitting: Family Medicine

## 2023-09-17 ENCOUNTER — Other Ambulatory Visit: Payer: Self-pay | Admitting: *Deleted

## 2023-09-17 MED ORDER — AMPHETAMINE-DEXTROAMPHETAMINE 15 MG PO TABS: 15.0000 mg | ORAL_TABLET | Freq: Two times a day (BID) | ORAL | 0 refills | Status: DC

## 2023-09-17 MED ORDER — GABAPENTIN 300 MG PO CAPS: ORAL_CAPSULE | ORAL | 0 refills | Status: DC

## 2023-09-17 NOTE — Telephone Encounter (Signed)
 Last seen 05/22/23, next appt 12/09/23  Dispenses   Dispensed Days Supply Quantity Provider Pharmacy  D-AMPHETAMINE SALT COMBO 10MG  TAB 10/31/2022 30 60 each Lomax, Amy, NP [facility] #.Marland KitchenMarland Kitchen

## 2023-09-17 NOTE — Telephone Encounter (Signed)
 Pt called needing a refill for her amphetamine-dextroamphetamine (ADDERALL) 15 MG tablet sent in to the [facility] on [address]

## 2023-10-21 ENCOUNTER — Other Ambulatory Visit: Payer: Self-pay | Admitting: Neurology

## 2023-10-21 DIAGNOSIS — G35 Multiple sclerosis: Secondary | ICD-10-CM

## 2023-10-22 NOTE — Telephone Encounter (Signed)
 Last seen on 05/22/23 Follow up scheduled on 12/06/23

## 2023-10-25 ENCOUNTER — Telehealth: Payer: Self-pay | Admitting: Family Medicine

## 2023-10-25 NOTE — Telephone Encounter (Signed)
 Pt came by to drop off handicap placard form. Please call when ready and she will PU.

## 2023-10-29 NOTE — Telephone Encounter (Signed)
 Amy out. Dr. Godwin Lat completed and form given back to medical records to process for pt.

## 2023-10-31 ENCOUNTER — Telehealth: Payer: Self-pay | Admitting: *Deleted

## 2023-10-31 ENCOUNTER — Other Ambulatory Visit: Payer: Self-pay | Admitting: Neurology

## 2023-10-31 NOTE — Telephone Encounter (Signed)
 Pt parking placard mailed on 10/31/2023 to pt home address.

## 2023-10-31 NOTE — Telephone Encounter (Signed)
 Last seen on 05/22/23 Follow up scheduled on 12/06/23

## 2023-11-15 ENCOUNTER — Other Ambulatory Visit: Payer: Self-pay | Admitting: Family Medicine

## 2023-11-15 MED ORDER — AMPHETAMINE-DEXTROAMPHETAMINE 15 MG PO TABS: 15.0000 mg | ORAL_TABLET | Freq: Two times a day (BID) | ORAL | 0 refills | Status: DC

## 2023-11-15 NOTE — Telephone Encounter (Signed)
 Pt called medication refill for  amphetamine -dextroamphetamine  (ADDERALL) 15 MG tablet   Pt stated she is almost out the patient request medication sent to: [facility] [id number] - [place], [place] - [address]

## 2023-11-15 NOTE — Telephone Encounter (Signed)
 Last seen 05/22/23 and next f/u 12/06/23. Last refilled 09/17/23 #60.

## 2023-11-17 ENCOUNTER — Other Ambulatory Visit: Payer: Self-pay | Admitting: Neurology

## 2023-11-17 DIAGNOSIS — G35 Multiple sclerosis: Secondary | ICD-10-CM

## 2023-11-19 ENCOUNTER — Other Ambulatory Visit: Payer: Self-pay

## 2023-11-29 ENCOUNTER — Other Ambulatory Visit: Payer: Self-pay

## 2023-11-29 ENCOUNTER — Other Ambulatory Visit: Payer: Self-pay | Admitting: Neurology

## 2023-11-29 MED ORDER — CELECOXIB 200 MG PO CAPS
200.0000 mg | ORAL_CAPSULE | Freq: Two times a day (BID) | ORAL | 1 refills | Status: DC
Start: 2023-11-29 — End: 2024-05-20

## 2023-11-29 MED ORDER — ESCITALOPRAM OXALATE 20 MG PO TABS
30.0000 mg | ORAL_TABLET | Freq: Every day | ORAL | 1 refills | Status: DC
Start: 2023-11-29 — End: 2024-05-20

## 2023-11-29 NOTE — Telephone Encounter (Signed)
 Last seen on 05/22/23 Follow up scheduled on 12/06/23

## 2023-12-06 ENCOUNTER — Ambulatory Visit (INDEPENDENT_AMBULATORY_CARE_PROVIDER_SITE_OTHER): Payer: Self-pay | Admitting: Neurology

## 2023-12-06 ENCOUNTER — Encounter: Payer: Self-pay | Admitting: Neurology

## 2023-12-06 VITALS — BP 112/73 | HR 112 | Ht 65.0 in | Wt 146.5 lb

## 2023-12-06 DIAGNOSIS — E559 Vitamin D deficiency, unspecified: Secondary | ICD-10-CM

## 2023-12-06 DIAGNOSIS — R208 Other disturbances of skin sensation: Secondary | ICD-10-CM

## 2023-12-06 DIAGNOSIS — R5383 Other fatigue: Secondary | ICD-10-CM

## 2023-12-06 DIAGNOSIS — R4189 Other symptoms and signs involving cognitive functions and awareness: Secondary | ICD-10-CM

## 2023-12-06 DIAGNOSIS — F418 Other specified anxiety disorders: Secondary | ICD-10-CM

## 2023-12-06 DIAGNOSIS — Z79899 Other long term (current) drug therapy: Secondary | ICD-10-CM

## 2023-12-06 DIAGNOSIS — G35 Multiple sclerosis: Secondary | ICD-10-CM

## 2023-12-06 MED ORDER — AMPHETAMINE-DEXTROAMPHETAMINE 15 MG PO TABS: 15.0000 mg | ORAL_TABLET | Freq: Two times a day (BID) | ORAL | 0 refills | Status: DC

## 2023-12-06 NOTE — Progress Notes (Signed)
 [facility]  PATIENT: Diane Carson DOB: 1965/10/17  REFERRING DOCTOR OR PCP:  Referred by Dr. Annamary Barrio. Her PCP is Melynda Stagger. SOURCE: Patient, notes from Dr. Tilda Fogo, MRI and lab reports, MRI images on PACS.  _________________________________   HISTORICAL  CHIEF COMPLAINT:  Chief Complaint  Patient presents with   Follow-up    Pt in room 10 alone. Here for MS follow up. On Vumerity , pt said having trouble with thought ( not new) pt said Vumerity  is working well for her. Last eye exam:10/24, no recent falls.     HISTORY OF PRESENT ILLNESS:   Diane Carson is a 58 y.o. 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  Update 12/06/2023: Her MS is stable and no exacerbations or new neurologic symptoms.  She switched to Vumerity  when she lost.   No new neurologic symptoms or exacerbations.   She does notes mildly more fatigue and mildly more word finding issues.  Labs 05/2022 showed good lymphocyte count of 2.1.  LFT fine. .  Vit D was mildly low and she was advised to take 5000 U daily.    Her gait is stable.   She ha a left foot drop that worsens with longer distance or in heat.    Balance is slightly off and she uses the bannister on stairs.  She has a seat in her shower.   Strength is fine.  Spasms are well controlled with tizanidine  2-3 times daily and she tolerates it well.. In 2024, she had dysesthetic pain in the limbs sciatic type pain on her left are much better with lamotrigine  100 mg daily and gabapentin  300 mg - 600 mg and amitriptyline .      Her vision is slightly blurry but stable.  She feels it is a bit worse when more tired.     She will be seeing ophthalmology in a few months.   Bladder is fine.   She has decreased focus/attention and fatigue.    Adderall has helped cognition and she tolerates it well.   She sometimes skips the pm dose on weekends.    Mood is doing well on escitalopram  20 mg and lamotrigine  100 mg po bid.   She has sleep maintenance insomnia  doing well on amitriptyline    No dry mouth or other s.e.        She works Clinical cytogeneticist a Research officer, trade [employer].   Her boss dies and she is helping to wrap up a transfer to her.    MS History:    In 1999, she had left optic neuritis. She did not receive steroids at that time but did have an MRI of the brain that was reportedly normal. She did very well for the next 14 years. In 2013, she woke up with numbness in her feet the one day. Over the next 2 days numbness evolved to be more intense and be in the distribution up to the mid thoracic level.   She also had decreased balance and fell a couple times. She noted that the numbness was near total of the breast. She received several days of IV steroids and symptoms improved, but not completely to baseline.    Between 2013 and 2017,  she has had no definite exacerbation. However, she has noted more difficulty with cognitive function, emotional lability, and fatigue.    MRI of the brain and spine dated 06/07/2016 and compared with the MRI dated 08/28/2011.  The older MRI shows a couple small T2/FLAIR hyperintense foci in the deep white matter  and white matter. On the current study, there are about 6 or 7 more foci in the hemispheres including more in the periventricular white matter and 2 foci in the right cerebellar hemisphere. The 2017 MRI of the cervical spine shows mild degenerative changes (with foraminal narrowing to the left at C7T1) 5but a normal spinal cord. The 2017 MRI of the thoracic spine shows a chronic plaque at T7.  Also, there is a disc protrusion at T9-T10.   The NMO Ab is negative.     IMAGING MRI brain 06/06/2023 showed no new lesions  MRI brain 05/11/2021 showed Scattered T2/FLAIR hyperintense foci in the hemispheres in a pattern consistent with chronic demyelinating plaque associated with multiple sclerosis.  None of the foci enhanced.  Compared to the MRI from 12/25/2018, there are no new lesions.   MRI of the brain July 2020 in [facility]  showed one small new right frontal white matter focus compared to MRI 2018.     MRI cervical spine 2020 showed a very small dot in the spinal cord at C6C7 (left posterolateral), not clearly present in 2017 but also seen on the 2013 MRi --- artifact vs stable tiny focus  REVIEW OF SYSTEMS: Constitutional: No fevers, chills, sweats, or change in appetite.   She has Fatigue Eyes: Notes occ blurred vision.  N0 double vision, eye pain Ear, nose and throat: No hearing loss, ear pain, nasal congestion, sore throat Cardiovascular: No chest pain, palpitations Respiratory:  No shortness of breath at rest or with exertion.   No wheezes GastrointestinaI: No nausea, vomiting, diarrhea, abdominal pain, fecal incontinence Genitourinary:  No dysuria, urinary retention or frequency.  No nocturia. Musculoskeletal:  No neck pain, back pain Integumentary: No rash, pruritus, skin lesions Neurological: as above Psychiatric: Notes some depression and anxiety.  She is going through a divorce,  Endocrine: No palpitations, diaphoresis, change in appetite, change in weigh or increased thirst Hematologic/Lymphatic:  No anemia, purpura, petechiae. Allergic/Immunologic: No itchy/runny eyes, nasal congestion, recent allergic reactions, rashes  ALLERGIES: Allergies  Allergen Reactions   Atorvastatin     Ringing of the ears, itching, heartburn   Codeine Shortness Of Breath    HOME MEDICATIONS:  Current Outpatient Medications:    amitriptyline  (ELAVIL ) 25 MG tablet, TAKE 1 TABLET BY MOUTH AT BEDTIME, Disp: 90 tablet, Rfl: 0   amphetamine -dextroamphetamine  (ADDERALL) 15 MG tablet, Take 1 tablet by mouth 2 (two) times daily., Disp: 60 tablet, Rfl: 0   calcium carbonate (OSCAL) 1500 (600 Ca) MG TABS tablet, Take 600 mg of elemental calcium by mouth 2 (two) times daily with a meal., Disp: , Rfl:    celecoxib  (CELEBREX ) 200 MG capsule, Take 1 capsule (200 mg total) by mouth 2 (two) times daily., Disp: 180 capsule, Rfl:  1   cholecalciferol (VITAMIN D ) 1000 units tablet, Take 5,000 Units by mouth daily., Disp: , Rfl:    escitalopram  (LEXAPRO ) 20 MG tablet, Take 1.5 tablets (30 mg total) by mouth daily., Disp: 135 tablet, Rfl: 1   gabapentin  (NEURONTIN ) 300 MG capsule, TAKE 3 CAPSULES BY MOUTH TWICE A DAY, Disp: 540 capsule, Rfl: 0   lamoTRIgine  (LAMICTAL ) 100 MG tablet, TAKE 1 TABLET BY MOUTH 2 TIMES A DAY, Disp: 180 tablet, Rfl: 0   Multiple Vitamin (MULTIVITAMIN) tablet, Take 1 tablet by mouth daily., Disp: , Rfl:    tiZANidine  (ZANAFLEX ) 4 MG tablet, TAKE 2 TABLETS BY MOUTH 3 TIMES DAILY, Disp: 180 tablet, Rfl: 11   VUMERITY  231 MG CPDR, TAKE 2 CAPSULES (  462MG ) BY MOUTH TWICE DAILY., Disp: 120 capsule, Rfl: 0  PAST MEDICAL HISTORY: Past Medical History:  Diagnosis Date   Degenerative disc disease, cervical    Multiple sclerosis (HCC)    Osteoarthritis    thumbs   Osteoporosis    Vision abnormalities     PAST SURGICAL HISTORY: Past Surgical History:  Procedure Laterality Date   BREAST BIOPSY Right    Biopsy over 20 years ago, done in [place]    JOINT REPLACEMENT Right 09/07/2016   thumb    FAMILY HISTORY: Family History  Adopted: Yes  Problem Relation Age of Onset   Colon cancer Brother     SOCIAL HISTORY:  Social History   Socioeconomic History   Marital status: Married    Spouse name: Not on file   Number of children: Not on file   Years of education: Not on file   Highest education level: Not on file  Occupational History   Not on file  Tobacco Use   Smoking status: Former   Smokeless tobacco: Never  Vaping Use   Vaping status: Never Used  Substance and Sexual Activity   Alcohol use: No   Drug use: Yes    Types: Marijuana    Comment: daily marijuana use   Sexual activity: Not on file  Other Topics Concern   Not on file  Social History Narrative   Not on file   Social Drivers of Health   Financial Resource Strain: Not on file  Food Insecurity: Not on file   Transportation Needs: Not on file  Physical Activity: Not on file  Stress: Not on file  Social Connections: Unknown (11/15/2021)   Received from [facility]   Social Network    Social Network: Not on file  Intimate Partner Violence: Unknown (10/07/2021)   Received from [facility], [facility]   HITS    Physically Hurt: Not on file    Insult or Talk Down To: Not on file    Threaten Physical Harm: Not on file    Scream or Curse: Not on file     PHYSICAL EXAM  Vitals:   12/06/23 1129  Weight: 146 lb 8 oz (66.5 kg)  Height: 5\' 5"  (1.651 m)     Body mass index is 24.38 kg/m.   General: The patient is well-developed and well-nourished and in no acute distress    Neurologic Exam  Mental status: The patient is alert and oriented x 3 at the time of the examination. The patient has apparent normal recent and remote memory, with an apparently normal attention span and concentration ability.   Speech is normal.  Cranial nerves: Extraocular movements are full. Facial strength and sensation are normal.  Trapezius and sternocleidomastoid strength is normal. No dysarthria is noted.   Hearing is normal.  Motor:  Muscle bulk is normal.   Tone is normal. Strength is  5 / 5 in all 4 extremities except 4+/5 left  foot  Sensory: Symmetric touch and vibration sensation.  Coordination: Cerebellar testing reveals good finger-nose-finger and heel-to-shin bilaterally  Gait and station: Station is normal.   She a mild left foot drop.    Her tandem gait is poor .  Romberg is negative.   Reflexes: Deep tendon reflexes are symmetric and normal in arms.   She had increased deep tendon reflexes in the legs and there was spread at the knee, left > rigjt.  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   She will continue Vumerity .  We will check a CBC with differential today.  Check MRI of the brain later this year or early next year to determine if there is subclinical progression. 2.   Continue medications for MS symptoms and mood.    3.   Continue Adderall for ADD/cognitive changes related to MS.  Continue lamotrigine  100 mg po bid for dysesthesias 4.   F/u in 6 months, sooner if problems   Lorrain Rivers A. Godwin Lat, MD, PhD 12/06/2023, 11:33 AM Certified in Neurology, Clinical Neurophysiology, Sleep Medicine, Pain Medicine and Neuroimaging  [facility] [address] [place], [place] [address] [phone]

## 2023-12-07 ENCOUNTER — Ambulatory Visit: Payer: Self-pay | Admitting: Neurology

## 2023-12-07 LAB — CBC WITH DIFFERENTIAL/PLATELET
Basophils Absolute: 0.1 10*3/uL (ref 0.0–0.2)
Basos: 1 %
EOS (ABSOLUTE): 0.1 10*3/uL (ref 0.0–0.4)
Eos: 1 %
Hematocrit: 39.5 % (ref 34.0–46.6)
Hemoglobin: 12.9 g/dL (ref 11.1–15.9)
Immature Grans (Abs): 0 10*3/uL (ref 0.0–0.1)
Immature Granulocytes: 0 %
Lymphocytes Absolute: 2.5 10*3/uL (ref 0.7–3.1)
Lymphs: 35 %
MCH: 30.4 pg (ref 26.6–33.0)
MCHC: 32.7 g/dL (ref 31.5–35.7)
MCV: 93 fL (ref 79–97)
Monocytes Absolute: 0.7 10*3/uL (ref 0.1–0.9)
Monocytes: 10 %
Neutrophils Absolute: 3.8 10*3/uL (ref 1.4–7.0)
Neutrophils: 53 %
Platelets: 420 10*3/uL (ref 150–450)
RBC: 4.25 x10E6/uL (ref 3.77–5.28)
RDW: 13.4 % (ref 11.7–15.4)
WBC: 7.3 10*3/uL (ref 3.4–10.8)

## 2023-12-22 ENCOUNTER — Other Ambulatory Visit: Payer: Self-pay | Admitting: Neurology

## 2023-12-22 DIAGNOSIS — G35 Multiple sclerosis: Secondary | ICD-10-CM

## 2023-12-24 NOTE — Telephone Encounter (Signed)
 Last seen on 12/06/23 Follow up scheduled on 07/02/24

## 2023-12-29 ENCOUNTER — Other Ambulatory Visit: Payer: Self-pay | Admitting: Neurology

## 2023-12-31 NOTE — Telephone Encounter (Signed)
 Last seen on 12/06/23 Follow up scheduled on 07/02/24

## 2024-01-24 ENCOUNTER — Other Ambulatory Visit: Payer: Self-pay | Admitting: Neurology

## 2024-01-24 MED ORDER — AMPHETAMINE-DEXTROAMPHETAMINE 15 MG PO TABS: 15.0000 mg | ORAL_TABLET | Freq: Two times a day (BID) | ORAL | 0 refills | Status: DC

## 2024-01-24 NOTE — Telephone Encounter (Signed)
 You are work in [place] this pm (Dr.Sater patient) Last see on 12/06/23 Follow up scheduled on 07/02/24            amphetamine -dextroamphetamine  (ADDERALL) 15 MG tablet 12/06/2023 -- 60 tablet 0 [facility] 09...   Take 1 tablet by mouth 2 (two) times daily.     Rx pending to be signed

## 2024-01-24 NOTE — Telephone Encounter (Signed)
 Pt called to request medication refill:  amphetamine -dextroamphetamine  (ADDERALL) 15 MG tablet   Pt  would like medication sent to   [id number] - [place], [place] - [address] (Ph: [phone])

## 2024-01-30 ENCOUNTER — Other Ambulatory Visit: Payer: Self-pay | Admitting: Neurology

## 2024-01-31 ENCOUNTER — Other Ambulatory Visit: Payer: Self-pay | Admitting: *Deleted

## 2024-01-31 MED ORDER — GABAPENTIN 300 MG PO CAPS: ORAL_CAPSULE | ORAL | 1 refills | Status: AC

## 2024-01-31 NOTE — Telephone Encounter (Signed)
 Last seen on 12/06/23 Follow up scheduled 07/02/24

## 2024-03-10 ENCOUNTER — Other Ambulatory Visit: Payer: Self-pay | Admitting: *Deleted

## 2024-03-10 MED ORDER — AMITRIPTYLINE HCL 25 MG PO TABS: 25.0000 mg | ORAL_TABLET | Freq: Every day | ORAL | 0 refills | Status: DC

## 2024-03-10 NOTE — Telephone Encounter (Signed)
 Last seen on 12/06/23 Follow up scheduled on 07/02/24

## 2024-03-11 ENCOUNTER — Other Ambulatory Visit: Payer: Self-pay | Admitting: Neurology

## 2024-03-11 MED ORDER — AMPHETAMINE-DEXTROAMPHETAMINE 15 MG PO TABS: 15.0000 mg | ORAL_TABLET | Freq: Two times a day (BID) | ORAL | 0 refills | Status: DC

## 2024-03-11 NOTE — Telephone Encounter (Signed)
 Pt called to request medication refill:  amphetamine -dextroamphetamine  (ADDERALL) 15 MG tablet   Pt  would like medication sent to   [id number] - [place], [place] - [address] (Ph: [phone])

## 2024-03-11 NOTE — Telephone Encounter (Signed)
 Last seen 12/06/23 and next f/u 07/02/24. Last refilled 01/24/24 #60.

## 2024-04-10 ENCOUNTER — Other Ambulatory Visit: Payer: Self-pay | Admitting: Neurology

## 2024-04-10 MED ORDER — AMPHETAMINE-DEXTROAMPHETAMINE 15 MG PO TABS: 15.0000 mg | ORAL_TABLET | Freq: Two times a day (BID) | ORAL | 0 refills | Status: DC

## 2024-04-10 NOTE — Telephone Encounter (Signed)
 Last seen on 12/06/23 Follow up scheduled on 07/02/24   amphetamine -dextroamphetamine  (ADDERALL) 15 MG tablet 03/11/2024 -- 60 tablet 0 [facility] 09...   Take 1 tablet by mouth 2 (two) times daily.    Rx pending to be signed

## 2024-04-10 NOTE — Telephone Encounter (Signed)
 Prt called to request medication refill  amphetamine -dextroamphetamine  (ADDERALL) 15 MG tablet   Pt would like medication sent   [id number] - [place], [place] - [address] (Ph: [phone])

## 2024-05-12 ENCOUNTER — Other Ambulatory Visit: Payer: Self-pay | Admitting: Neurology

## 2024-05-12 MED ORDER — AMPHETAMINE-DEXTROAMPHETAMINE 15 MG PO TABS: 15.0000 mg | ORAL_TABLET | Freq: Two times a day (BID) | ORAL | 0 refills | Status: DC

## 2024-05-12 NOTE — Telephone Encounter (Signed)
Pt is requesting a refill for amphetamine-dextroamphetamine (ADDERALL) 15 MG tablet.  Pharmacy: [facility]

## 2024-05-12 NOTE — Telephone Encounter (Signed)
 Requested Prescriptions   Pending Prescriptions Disp Refills   amphetamine -dextroamphetamine  (ADDERALL) 15 MG tablet 60 tablet 0    Sig: Take 1 tablet by mouth 2 (two) times daily.   Last seen 12/06/23 Next appt 06/03/24 UNABLE TO CHECK DISPENSARY REORD:  Dispenses  No dispenses within 180 days of the adherence period (since 05/17/2023)

## 2024-05-13 ENCOUNTER — Other Ambulatory Visit: Payer: Self-pay | Admitting: *Deleted

## 2024-05-13 MED ORDER — AMITRIPTYLINE HCL 25 MG PO TABS: 25.0000 mg | ORAL_TABLET | Freq: Every day | ORAL | 0 refills | Status: AC

## 2024-05-13 NOTE — Telephone Encounter (Signed)
 Last seen on 12/06/23 Follow up scheduled on 06/03/24

## 2024-05-20 ENCOUNTER — Other Ambulatory Visit: Payer: Self-pay

## 2024-05-20 MED ORDER — CELECOXIB 200 MG PO CAPS: 200.0000 mg | ORAL_CAPSULE | Freq: Two times a day (BID) | ORAL | 1 refills | Status: AC

## 2024-05-20 MED ORDER — ESCITALOPRAM OXALATE 20 MG PO TABS: 30.0000 mg | ORAL_TABLET | Freq: Every day | ORAL | 1 refills | Status: AC

## 2024-06-02 NOTE — Patient Instructions (Signed)

## 2024-06-02 NOTE — Progress Notes (Unsigned)
 No chief complaint on file.    HISTORY OF PRESENT ILLNESS:  06/02/24 ALL:  Diane Carson is a 58 y.o. female here today for follow up for RRMS. She continues Vumerity . Labs stable 12/2023. Last imaging 06/2023 stable.    She feels that she is doing fairly well. No new or exacerbating symptoms. She feels gait is stable. No falls. She is not using an assistive device. She did have an event about a month ago where she reports the right side of her face was numb for about 2 days. Symptoms spontaneously resolved.   She continues tizanidine  8mg  TID, celecoxib  200mg  BID and gabapentin  900mg  BID for dysesthesias and generalized pain. Tizanidine  helps left leg cramps, worse at night. She is trying to do better with water intake. She drinks at least 32-40 ounces daily.   She does continues to note fatigue. She continues Adderall 15mg  IR BID. She feels that XR dosing worked better for attention deficit and cognitive fog but was having trouble getting it..   Lamotrigine  100mg  BID and escitalopram  30mg  daily helps significantly with mood. She feels she is doing very well from mood perspective.   She sleeps well. Amitriptyline  25mg  QHS helps with insomnia. She feels that she can not sleep without taking.   She takes vitamin D  5,000iu daily.   HISTORY (copied from Dr Duncan previous note)   Diane Carson is a 58 y.o. 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   Update 12/06/2023: Her MS is stable and no exacerbations or new neurologic symptoms.  She switched to Vumerity  when she lost.   No new neurologic symptoms or exacerbations.   She does notes mildly more fatigue and mildly more word finding issues.   Labs 05/2022 showed good lymphocyte count of 2.1.  LFT fine. .  Vit D was mildly low and she was advised to take 5000 U daily.     Her gait is stable.   She ha a left foot drop that worsens with longer distance or in heat.    Balance is slightly off and she uses the bannister on stairs.  She has a seat in her shower.    Strength is fine.  Spasms are well controlled with tizanidine  2-3 times daily and she tolerates it well.. In 2024, she had dysesthetic pain in the limbs sciatic type pain on her left are much better with lamotrigine  100 mg daily and gabapentin  300 mg - 600 mg and amitriptyline .       Her vision is slightly blurry but stable.  She feels it is a bit worse when more tired.     She will be seeing ophthalmology in a few months.    Bladder is fine.    She has decreased focus/attention and fatigue.    Adderall has helped cognition and she tolerates it well.   She sometimes skips the pm dose on weekends.    Mood is doing well on escitalopram  20 mg and lamotrigine  100 mg po bid.   She has sleep maintenance insomnia doing well on amitriptyline    No dry mouth or other s.e.         She works clinical cytogeneticist a research officer, trade union.   Her boss dies and she is helping to wrap up a transfer to her.      MS History:    In 1999, she had left optic neuritis. She did not receive steroids at that time but did have an MRI of the brain that was reportedly normal. She did very  well for the next 14 years. In 2013, she woke up with numbness in her feet the one day. Over the next 2 days numbness evolved to be more intense and be in the distribution up to the mid thoracic level.   She also had decreased balance and fell a couple times. She noted that the numbness was near total of the breast. She received several days of IV steroids and symptoms improved, but not completely to baseline.    Between 2013 and 2017,  she has had no definite exacerbation. However, she has noted more difficulty with cognitive function, emotional lability, and fatigue.    MRI of the brain and spine dated 06/07/2016 and compared with the MRI dated 08/28/2011.  The older MRI shows a couple small T2/FLAIR hyperintense foci in the deep white matter and white matter. On the current study, there are about 6 or 7 more foci in the hemispheres including more in the  periventricular white matter and 2 foci in the right cerebellar hemisphere. The 2017 MRI of the cervical spine shows mild degenerative changes (with foraminal narrowing to the left at C7T1) 5but a normal spinal cord. The 2017 MRI of the thoracic spine shows a chronic plaque at T7.  Also, there is a disc protrusion at T9-T10.   The NMO Ab is negative.      IMAGING MRI brain 06/06/2023 showed no new lesions   MRI brain 05/11/2021 showed Scattered T2/FLAIR hyperintense foci in the hemispheres in a pattern consistent with chronic demyelinating plaque associated with multiple sclerosis.  None of the foci enhanced.  Compared to the MRI from 12/25/2018, there are no new lesions.   MRI of the brain July 2020 in [facility] showed one small new right frontal white matter focus compared to MRI 2018.      MRI cervical spine 2020 showed a very small dot in the spinal cord at C6C7 (left posterolateral), not clearly present in 2017 but also seen on the 2013 MRi --- artifact vs stable tiny focus   REVIEW OF SYSTEMS: Out of a complete 14 system review of symptoms, the patient complains only of the following symptoms, dysesthesias, insomnia, anxiety, depression, brain fog, inattention, cramps, and all other reviewed systems are negative.   ALLERGIES: Allergies  Allergen Reactions   Atorvastatin     Ringing of the ears, itching, heartburn   Codeine Shortness Of Breath     HOME MEDICATIONS: Outpatient Medications Prior to Visit  Medication Sig Dispense Refill   amitriptyline  (ELAVIL ) 25 MG tablet Take 1 tablet (25 mg total) by mouth at bedtime. 90 tablet 0   amphetamine -dextroamphetamine  (ADDERALL) 15 MG tablet Take 1 tablet by mouth 2 (two) times daily. 60 tablet 0   calcium carbonate (OSCAL) 1500 (600 Ca) MG TABS tablet Take 600 mg of elemental calcium by mouth 2 (two) times daily with a meal.     celecoxib  (CELEBREX ) 200 MG capsule Take 1 capsule (200 mg total) by mouth 2 (two) times daily. 180 capsule 1    cholecalciferol (VITAMIN D ) 1000 units tablet Take 5,000 Units by mouth daily.     Diroximel Fumarate  (VUMERITY ) 231 MG CPDR TAKE 2 CAPSULES (462MG ) BY MOUTH TWICE DAILY. 120 capsule 4   escitalopram  (LEXAPRO ) 20 MG tablet Take 1.5 tablets (30 mg total) by mouth daily. 135 tablet 1   gabapentin  (NEURONTIN ) 300 MG capsule TAKE 3 CAPSULES BY MOUTH TWICE A DAY 540 capsule 1   lamoTRIgine  (LAMICTAL ) 100 MG tablet TAKE 1 TABLET BY MOUTH  2 TIMES A DAY 180 tablet 1   Multiple Vitamin (MULTIVITAMIN) tablet Take 1 tablet by mouth daily.     tiZANidine  (ZANAFLEX ) 4 MG tablet TAKE TWO TABLETS BY MOUTH THREE TIMES A DAY 180 tablet 1   No facility-administered medications prior to visit.     PAST MEDICAL HISTORY: Past Medical History:  Diagnosis Date   Degenerative disc disease, cervical    Multiple sclerosis    Osteoarthritis    thumbs   Osteoporosis    Vision abnormalities      PAST SURGICAL HISTORY: Past Surgical History:  Procedure Laterality Date   BREAST BIOPSY Right    Biopsy over 20 years ago, done in [place]    JOINT REPLACEMENT Right 09/07/2016   thumb     FAMILY HISTORY: Family History  Adopted: Yes  Problem Relation Age of Onset   Colon cancer Brother      SOCIAL HISTORY: Social History   Socioeconomic History   Marital status: Married    Spouse name: Not on file   Number of children: Not on file   Years of education: Not on file   Highest education level: Not on file  Occupational History   Not on file  Tobacco Use   Smoking status: Former   Smokeless tobacco: Never  Vaping Use   Vaping status: Never Used  Substance and Sexual Activity   Alcohol use: No   Drug use: Yes    Types: Marijuana    Comment: daily marijuana use   Sexual activity: Not on file  Other Topics Concern   Not on file  Social History Narrative   Not on file   Social Drivers of Health   Financial Resource Strain: Not on file  Food Insecurity: Not on file  Transportation  Needs: Not on file  Physical Activity: Not on file  Stress: Not on file  Social Connections: Unknown (11/15/2021)   Received from [facility]   Social Network    Social Network: Not on file  Intimate Partner Violence: Unknown (10/07/2021)   Received from [facility]   HITS    Physically Hurt: Not on file    Insult or Talk Down To: Not on file    Threaten Physical Harm: Not on file    Scream or Curse: Not on file     PHYSICAL EXAM  There were no vitals filed for this visit.    There is no height or weight on file to calculate BMI.  Generalized: Well developed, in no acute distress  Cardiology: normal rate and rhythm, no murmur auscultated  Respiratory: clear to auscultation bilaterally    Neurological examination  Mentation: Alert oriented to time, place, history taking. Follows all commands speech and language fluent Cranial nerve II-XII: Pupils were equal round reactive to light. Extraocular movements were full, visual field were full on confrontational test. Facial sensation and strength were normal. Uvula tongue midline. Head turning and shoulder shrug  were normal and symmetric. Motor: The motor testing reveals 5 over 5 strength of all 4 extremities with exception of 4+/5 left hip flexion. Good symmetric motor tone is noted throughout.  Sensory: Sensory testing is intact to soft touch on all 4 extremities. No evidence of extinction is noted.  Coordination: Cerebellar testing reveals good finger-nose-finger and heel-to-shin bilaterally.  Gait and station: Gait is normal.    DIAGNOSTIC DATA (LABS, IMAGING, TESTING) - I reviewed patient records, labs, notes, testing and imaging myself where available.  Lab Results  Component Value Date  WBC 7.3 12/06/2023   HGB 12.9 12/06/2023   HCT 39.5 12/06/2023   MCV 93 12/06/2023   PLT 420 12/06/2023      Component Value Date/Time   NA 141 05/02/2021 1028   K 4.6 05/02/2021 1028   CL 103 05/02/2021 1028   CO2 25  05/02/2021 1028   GLUCOSE 82 05/02/2021 1028   BUN 13 05/02/2021 1028   CREATININE 0.85 05/02/2021 1028   CALCIUM 9.5 05/02/2021 1028   PROT 6.9 05/04/2022 1128   ALBUMIN 4.8 05/04/2022 1128   AST 25 05/04/2022 1128   ALT 22 05/04/2022 1128   ALKPHOS 97 05/04/2022 1128   BILITOT <0.2 05/04/2022 1128   GFRNONAA 90 04/20/2017 1018   GFRAA 103 04/20/2017 1018   Lab Results  Component Value Date   CHOL 290 (H) 05/04/2022   HDL 77 05/04/2022   LDLCALC 204 (H) 05/04/2022   TRIG 63 05/04/2022   CHOLHDL 3.8 05/04/2022   No results found for: HGBA1C No results found for: VITAMINB12 No results found for: TSH      No data to display               No data to display           ASSESSMENT AND PLAN  58 y.o. year old female  has a past medical history of Degenerative disc disease, cervical, Multiple sclerosis, Osteoarthritis, Osteoporosis, and Vision abnormalities. here with    No diagnosis found.  Kory is doing well. She will continue Vumerity  as prescribed. We will update labs, today. Will update MRI brain. She will continue tizanidine , celecoxib , gabapentin , Adderall, lamotrigine , escitalopram  and amitriptyline  as prescribed. PDMP appropriate. She will continue healthy lifestyle habits. Follow up with Dr Vear in 6 months.    No orders of the defined types were placed in this encounter.     No orders of the defined types were placed in this encounter.    Greig Forbes, MSN, FNP-C 06/02/2024, 7:59 AM  [facility] [address] [place], [place] [address] [phone]

## 2024-06-03 ENCOUNTER — Ambulatory Visit (INDEPENDENT_AMBULATORY_CARE_PROVIDER_SITE_OTHER): Payer: Self-pay | Admitting: Family Medicine

## 2024-06-03 ENCOUNTER — Telehealth: Payer: Self-pay | Admitting: Neurology

## 2024-06-03 ENCOUNTER — Encounter: Payer: Self-pay | Admitting: Family Medicine

## 2024-06-03 VITALS — BP 110/70 | Ht 65.0 in | Wt 145.5 lb

## 2024-06-03 DIAGNOSIS — F418 Other specified anxiety disorders: Secondary | ICD-10-CM

## 2024-06-03 DIAGNOSIS — R208 Other disturbances of skin sensation: Secondary | ICD-10-CM

## 2024-06-03 DIAGNOSIS — Z79899 Other long term (current) drug therapy: Secondary | ICD-10-CM

## 2024-06-03 DIAGNOSIS — G35A Relapsing-remitting multiple sclerosis: Secondary | ICD-10-CM

## 2024-06-03 DIAGNOSIS — G35D Multiple sclerosis, unspecified: Secondary | ICD-10-CM

## 2024-06-03 DIAGNOSIS — E559 Vitamin D deficiency, unspecified: Secondary | ICD-10-CM

## 2024-06-03 DIAGNOSIS — R5383 Other fatigue: Secondary | ICD-10-CM

## 2024-06-03 DIAGNOSIS — R4189 Other symptoms and signs involving cognitive functions and awareness: Secondary | ICD-10-CM

## 2024-06-03 MED ORDER — VUMERITY 231 MG PO CPDR: 2.0000 | DELAYED_RELEASE_CAPSULE | Freq: Two times a day (BID) | ORAL | 4 refills | Status: AC

## 2024-06-03 MED ORDER — TIZANIDINE HCL 4 MG PO TABS: 8.0000 mg | ORAL_TABLET | Freq: Three times a day (TID) | ORAL | 1 refills | Status: AC | PRN

## 2024-06-03 MED ORDER — LAMOTRIGINE 100 MG PO TABS: 100.0000 mg | ORAL_TABLET | Freq: Two times a day (BID) | ORAL | 1 refills | Status: AC

## 2024-06-03 NOTE — Telephone Encounter (Signed)
 [facility]  called to request for a new script  for Pt medication  Diroximel Fumarate  (VUMERITY ) 231 MG CPDR   Pt medication is to be sent to   [facility] - [place], [place] - [id number] (Ph: [phone])

## 2024-06-03 NOTE — Telephone Encounter (Signed)
 refilled

## 2024-06-04 ENCOUNTER — Ambulatory Visit: Payer: Self-pay | Admitting: Family Medicine

## 2024-06-04 LAB — CBC WITH DIFFERENTIAL/PLATELET
Basophils Absolute: 0.1 x10E3/uL (ref 0.0–0.2)
Basos: 1 %
EOS (ABSOLUTE): 0.2 x10E3/uL (ref 0.0–0.4)
Eos: 2 %
Hematocrit: 38.7 % (ref 34.0–46.6)
Hemoglobin: 12.8 g/dL (ref 11.1–15.9)
Immature Grans (Abs): 0 x10E3/uL (ref 0.0–0.1)
Immature Granulocytes: 0 %
Lymphocytes Absolute: 2.6 x10E3/uL (ref 0.7–3.1)
Lymphs: 27 %
MCH: 30.5 pg (ref 26.6–33.0)
MCHC: 33.1 g/dL (ref 31.5–35.7)
MCV: 92 fL (ref 79–97)
Monocytes Absolute: 0.9 x10E3/uL (ref 0.1–0.9)
Monocytes: 9 %
Neutrophils Absolute: 6.1 x10E3/uL (ref 1.4–7.0)
Neutrophils: 61 %
Platelets: 428 x10E3/uL (ref 150–450)
RBC: 4.2 x10E6/uL (ref 3.77–5.28)
RDW: 13.1 % (ref 11.7–15.4)
WBC: 10 x10E3/uL (ref 3.4–10.8)

## 2024-06-04 LAB — VITAMIN D 25 HYDROXY (VIT D DEFICIENCY, FRACTURES): Vit D, 25-Hydroxy: 34.8 ng/mL (ref 30.0–100.0)

## 2024-06-19 ENCOUNTER — Other Ambulatory Visit: Payer: Self-pay | Admitting: Family Medicine

## 2024-06-19 MED ORDER — AMPHETAMINE-DEXTROAMPHETAMINE 15 MG PO TABS: 15.0000 mg | ORAL_TABLET | Freq: Two times a day (BID) | ORAL | 0 refills | Status: DC

## 2024-06-19 NOTE — Telephone Encounter (Signed)
 Patient request refill for amphetamine -dextroamphetamine  (ADDERALL) 15 MG tablet  send to [facility] [phone]

## 2024-06-19 NOTE — Telephone Encounter (Signed)
 Requested Prescriptions   Pending Prescriptions Disp Refills   amphetamine -dextroamphetamine  (ADDERALL) 15 MG tablet 60 tablet 0    Sig: Take 1 tablet by mouth 2 (two) times daily.   Last seen 06/03/24 Next appt 01/08/25  I am unable to check registry   Dispenses  No dispenses within 180 days of the adherence period (since 06/24/2023)   How do dispenses affect the score?    Outpatient Orders     The patient is taking this medication long-term.     Start Date End Date Dispense Refills Pharmacy   amphetamine -dextroamphetamine  (ADDERALL) 15 MG tablet 05/12/2024 -- 60 tablet 0 [facility]...   Take 1 tablet by mouth 2 (two) times daily.    amphetamine -dextroamphetamine  (ADDERALL) 15 MG tablet 04/10/2024 05/12/2024 60 tablet 0 [facility] 09...   Take 1 tablet by mouth 2 (two) times daily.    amphetamine -dextroamphetamine  (ADDERALL) 15 MG tablet 03/11/2024 04/10/2024 60 tablet 0 [facility] 09...   Take 1 tablet by mouth 2 (two) times daily.    amphetamine -dextroamphetamine  (ADDERALL) 15 MG tablet 01/24/2024 03/11/2024 60 tablet 0 [facility] 09...   Take 1 tablet by mouth 2 (two) times daily.    amphetamine -dextroamphetamine  (ADDERALL) 15 MG tablet 12/06/2023 01/24/2024 60 tablet 0 [facility] 09...   Take 1 tablet by mouth 2 (two) times daily.    amphetamine -dextroamphetamine  (ADDERALL) 15 MG tablet 11/15/2023 12/06/2023 60 tablet 0 [facility] 09...   Take 1 tablet by mouth 2 (two) times daily.    amphetamine -dextroamphetamine  (ADDERALL) 15 MG tablet 09/17/2023 11/15/2023 60 tablet 0 [facility] 09...   Take 1 tablet by mouth 2 (two) times daily.    amphetamine -dextroamphetamine  (ADDERALL) 15 MG tablet 07/17/2023 09/17/2023 60 tablet 0 [facility] 09...   Take 1 tablet by mouth 2 (two) times daily.    amphetamine -dextroamphetamine  (ADDERALL) 15 MG tablet 05/22/2023 07/17/2023 60 tablet 0  [facility] 09...   Take 1 tablet by mouth 2 (two) times daily.    amphetamine -dextroamphetamine  (ADDERALL) 15 MG tablet 04/24/2023 05/22/2023 60 tablet 0 [facility] 09...   Take 1 tablet by mouth 2 (two) times daily.

## 2024-07-02 ENCOUNTER — Ambulatory Visit: Payer: Self-pay | Admitting: Family Medicine

## 2024-07-24 ENCOUNTER — Telehealth: Payer: Self-pay | Admitting: Family Medicine

## 2024-07-24 MED ORDER — AMPHETAMINE-DEXTROAMPHETAMINE 15 MG PO TABS: 15.0000 mg | ORAL_TABLET | Freq: Two times a day (BID) | ORAL | 0 refills | Status: AC

## 2024-07-24 NOTE — Telephone Encounter (Signed)
 Patient request refill for amphetamine -dextroamphetamine  (ADDERALL) 15 MG tablet  send to [facility] [phone]

## 2024-07-24 NOTE — Telephone Encounter (Signed)
 Pt Last Seen 06/03/24 Upcoming Appointment 01/08/25  Adderall Last Filled 06/19/24

## 2024-08-08 ENCOUNTER — Other Ambulatory Visit: Payer: Self-pay | Admitting: Neurology

## 2025-01-08 ENCOUNTER — Ambulatory Visit: Payer: Self-pay | Admitting: Neurology
